# Patient Record
Sex: Male | Born: 1990 | Race: Black or African American | Hispanic: No | Marital: Single | Smoking: Current some day smoker
Health system: Southern US, Community
[De-identification: ages and names within clinical notes are randomized; demographics above are authoritative.]

---
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
## 2014-05-26 ENCOUNTER — Emergency Department (HOSPITAL_COMMUNITY)
Admission: EM | Admit: 2014-05-26 | Discharge: 2014-05-27 | Disposition: A | Discharge status: Home / Self Care | Payer: Self-pay | Attending: Emergency Medicine | Admitting: Emergency Medicine

## 2014-05-26 ENCOUNTER — Encounter (HOSPITAL_COMMUNITY): Payer: Self-pay | Admitting: *Deleted

## 2014-05-26 DIAGNOSIS — J069 Acute upper respiratory infection, unspecified: Secondary | ICD-10-CM | POA: Insufficient documentation

## 2014-05-26 DIAGNOSIS — B9789 Other viral agents as the cause of diseases classified elsewhere: Secondary | ICD-10-CM

## 2014-05-26 NOTE — ED Notes (Signed)
The pt is c/o a cold body aches chills sweating cough raw throat for 7 days.

## 2014-05-27 ENCOUNTER — Emergency Department (HOSPITAL_COMMUNITY): Payer: Self-pay

## 2014-05-27 LAB — RAPID STREP SCREEN (MED CTR MEBANE ONLY): STREPTOCOCCUS, GROUP A SCREEN (DIRECT): NEGATIVE

## 2014-05-27 NOTE — ED Provider Notes (Signed)
CSN: [id number]     Arrival date & time 05/26/14  2258 History   First MD Initiated Contact with Patient 05/26/14 2359     Chief Complaint  Patient presents with  . Generalized Body Aches     (Consider location/radiation/quality/duration/timing/severity/associated sxs/prior Treatment) HPI Arthur Vaughn is a 24 y.o. male . No medical problems, presents emergency Department complaining of cough, congestion, sore throat, body aches, chills, fever for the last week. Patient states symptoms are not improving. He has not been taking anything until this evening when he took ibuprofen. He has not been checking his temperatures, but states he felt hot. He denies any nausea or vomiting. No diarrhea. No recent ill contacts.He reports no other complaints at this time. Nothing is making his symptoms better or worse   History reviewed. No pertinent past medical history. History reviewed. No pertinent past surgical history. No family history on file. History  Substance Use Topics  . Smoking status: Never Smoker   . Smokeless tobacco: Not on file  . Alcohol Use: No    Review of Systems  Constitutional: Positive for fever and chills.  HENT: Positive for congestion and sore throat. Negative for ear pain.   Respiratory: Positive for cough. Negative for chest tightness and shortness of breath.   Cardiovascular: Negative for chest pain, palpitations and leg swelling.  Gastrointestinal: Negative for nausea, vomiting, abdominal pain, diarrhea and abdominal distention.  Genitourinary: Negative for dysuria, urgency, frequency and hematuria.  Musculoskeletal: Negative for myalgias, arthralgias, neck pain and neck stiffness.  Skin: Negative for rash.  Allergic/Immunologic: Negative for immunocompromised state.  Neurological: Negative for dizziness, weakness, light-headedness, numbness and headaches.  All other systems reviewed and are negative.     Allergies  Review of patient's allergies indicates no  known allergies.  Home Medications   Prior to Admission medications   Not on File   BP 114/70 mmHg  Pulse 66  Temp(Src) 98.9 F (37.2 C) (Oral)  Resp 16  Ht 5\' 7"  (1.702 m)  Wt 135 lb (61.236 kg)  BMI 21.14 kg/m2  SpO2 99% Physical Exam  Constitutional: He is oriented to person, place, and time. He appears well-developed and well-nourished. No distress.  HENT:  Head: Normocephalic and atraumatic.  Right Ear: Tympanic membrane and external ear normal.  Left Ear: Tympanic membrane and external ear normal.  Nose: Mucosal edema and rhinorrhea present.  Mouth/Throat: Posterior oropharyngeal erythema present. No oropharyngeal exudate or posterior oropharyngeal edema.  Eyes: Conjunctivae are normal.  Neck: Normal range of motion. Neck supple.  Cardiovascular: Normal rate, regular rhythm and normal heart sounds.   Pulmonary/Chest: Effort normal and breath sounds normal. No respiratory distress. He has no wheezes. He has no rales.  Abdominal: Soft. Bowel sounds are normal. He exhibits no distension. There is no tenderness. There is no rebound.  Musculoskeletal: He exhibits no edema.  Lymphadenopathy:    He has no cervical adenopathy.  Neurological: He is alert and oriented to person, place, and time.  Skin: Skin is warm and dry.  Nursing note and vitals reviewed.   ED Course  Procedures (including critical care time) Labs Review Labs Reviewed  RAPID STREP SCREEN  CULTURE, GROUP A STREP    Imaging Review Dg Chest 2 View  05/27/2014   CLINICAL DATA:  Body aches and chills.  Cough for 7 days.  EXAM: CHEST  2 VIEW  COMPARISON:  None.  FINDINGS: Midline trachea.  Normal heart size and mediastinal contours.  Sharp costophrenic angles.  No pneumothorax.  Clear lungs.  IMPRESSION: No active cardiopulmonary disease.   Electronically Signed   By: Jeronimo Greaves M.D.   On: 05/27/2014 01:26     EKG Interpretation None      MDM   Final diagnoses:  Viral URI with cough    Patient  is here with URI symptoms for 7 days, productive cough with green sputum. Will get chest x-ray, oropharynx erythematous, strep screen obtained. Vital signs are normal, he is nontoxic appearing otherwise.  CXR and strep screen negative. Pt has normal vs. No abnormal exam findings. Most likely viral uri. Home with symptomatic treatments.   Filed Vitals:   05/26/14 2322 05/27/14 0208  BP: 114/70 120/80  Pulse: 66 65  Temp: 98.9 F (37.2 C) 98 F (36.7 C)  TempSrc: Oral Oral  Resp: 16 16  Height:  (1.702 m)   Weight: 135 lb (61.236 kg)   SpO2: 99% 98%     Jaynie Crumble, PA-C 05/27/14 1720  Doug Sou, MD 05/27/14 2320

## 2014-05-27 NOTE — Discharge Instructions (Signed)
Take tylenol or motrin for cough and congestion. Rest. Drink plenty of fluids. Robitussin and mucinex for cough. Sudafed for congestion. Follow up with urgent care as needed.    Upper Respiratory Infection, Adult An upper respiratory infection (URI) is also sometimes known as the common cold. The upper respiratory tract includes the nose, sinuses, throat, trachea, and bronchi. Bronchi are the airways leading to the lungs. Most people improve within 1 week, but symptoms can last up to 2 weeks. A residual cough may last even longer.  CAUSES Many different viruses can infect the tissues lining the upper respiratory tract. The tissues become irritated and inflamed and often become very moist. Mucus production is also common. A cold is contagious. You can easily spread the virus to others by oral contact. This includes kissing, sharing a glass, coughing, or sneezing. Touching your mouth or nose and then touching a surface, which is then touched by another person, can also spread the virus. SYMPTOMS  Symptoms typically develop 1 to 3 days after you come in contact with a cold virus. Symptoms vary from person to person. They may include:  Runny nose.  Sneezing.  Nasal congestion.  Sinus irritation.  Sore throat.  Loss of voice (laryngitis).  Cough.  Fatigue.  Muscle aches.  Loss of appetite.  Headache.  Low-grade fever. DIAGNOSIS  You might diagnose your own cold based on familiar symptoms, since most people get a cold 2 to 3 times a year. Your caregiver can confirm this based on your exam. Most importantly, your caregiver can check that your symptoms are not due to another disease such as strep throat, sinusitis, pneumonia, asthma, or epiglottitis. Blood tests, throat tests, and X-rays are not necessary to diagnose a common cold, but they may sometimes be helpful in excluding other more serious diseases. Your caregiver will decide if any further tests are required. RISKS AND  COMPLICATIONS  You may be at risk for a more severe case of the common cold if you smoke cigarettes, have chronic heart disease (such as heart failure) or lung disease (such as asthma), or if you have a weakened immune system. The very young and very old are also at risk for more serious infections. Bacterial sinusitis, middle ear infections, and bacterial pneumonia can complicate the common cold. The common cold can worsen asthma and chronic obstructive pulmonary disease (COPD). Sometimes, these complications can require emergency medical care and may be life-threatening. PREVENTION  The best way to protect against getting a cold is to practice good hygiene. Avoid oral or hand contact with people with cold symptoms. Wash your hands often if contact occurs. There is no clear evidence that vitamin C, vitamin E, echinacea, or exercise reduces the chance of developing a cold. However, it is always recommended to get plenty of rest and practice good nutrition. TREATMENT  Treatment is directed at relieving symptoms. There is no cure. Antibiotics are not effective, because the infection is caused by a virus, not by bacteria. Treatment may include:  Increased fluid intake. Sports drinks offer valuable electrolytes, sugars, and fluids.  Breathing heated mist or steam (vaporizer or shower).  Eating chicken soup or other clear broths, and maintaining good nutrition.  Getting plenty of rest.  Using gargles or lozenges for comfort.  Controlling fevers with ibuprofen or acetaminophen as directed by your caregiver.  Increasing usage of your inhaler if you have asthma. Zinc gel and zinc lozenges, taken in the first 24 hours of the common cold, can shorten the duration and  lessen the severity of symptoms. Pain medicines may help with fever, muscle aches, and throat pain. A variety of non-prescription medicines are available to treat congestion and runny nose. Your caregiver can make recommendations and may  suggest nasal or lung inhalers for other symptoms.  HOME CARE INSTRUCTIONS   Only take over-the-counter or prescription medicines for pain, discomfort, or fever as directed by your caregiver.  Use a warm mist humidifier or inhale steam from a shower to increase air moisture. This may keep secretions moist and make it easier to breathe.  Drink enough water and fluids to keep your urine clear or pale yellow.  Rest as needed.  Return to work when your temperature has returned to normal or as your caregiver advises. You may need to stay home longer to avoid infecting others. You can also use a face mask and careful hand washing to prevent spread of the virus. SEEK MEDICAL CARE IF:   After the first few days, you feel you are getting worse rather than better.  You need your caregiver's advice about medicines to control symptoms.  You develop chills, worsening shortness of breath, or brown or red sputum. These may be signs of pneumonia.  You develop yellow or brown nasal discharge or pain in the face, especially when you bend forward. These may be signs of sinusitis.  You develop a fever, swollen neck glands, pain with swallowing, or white areas in the back of your throat. These may be signs of strep throat. SEEK IMMEDIATE MEDICAL CARE IF:   You have a fever.  You develop severe or persistent headache, ear pain, sinus pain, or chest pain.  You develop wheezing, a prolonged cough, cough up blood, or have a change in your usual mucus (if you have chronic lung disease).  You develop sore muscles or a stiff neck. Document Released: 07/08/2000 Document Revised: 04/06/2011 Document Reviewed: 04/19/2013 [facility] Patient Information 2015 [place], [place]. This information is not intended to replace advice given to you by your health care provider. Make sure you discuss any questions you have with your health care provider.

## 2014-05-29 LAB — CULTURE, GROUP A STREP

## 2014-06-20 ENCOUNTER — Encounter (HOSPITAL_COMMUNITY): Payer: Self-pay | Admitting: Emergency Medicine

## 2014-06-20 ENCOUNTER — Emergency Department (HOSPITAL_COMMUNITY)
Admission: EM | Admit: 2014-06-20 | Discharge: 2014-06-21 | Disposition: A | Discharge status: Home / Self Care | Payer: Self-pay | Attending: Emergency Medicine | Admitting: Emergency Medicine

## 2014-06-20 DIAGNOSIS — R51 Headache: Secondary | ICD-10-CM | POA: Insufficient documentation

## 2014-06-20 DIAGNOSIS — B9789 Other viral agents as the cause of diseases classified elsewhere: Secondary | ICD-10-CM

## 2014-06-20 DIAGNOSIS — J069 Acute upper respiratory infection, unspecified: Secondary | ICD-10-CM | POA: Insufficient documentation

## 2014-06-20 DIAGNOSIS — J029 Acute pharyngitis, unspecified: Secondary | ICD-10-CM | POA: Insufficient documentation

## 2014-06-20 NOTE — ED Notes (Signed)
Pt. reports sore throat with occasional dry cough onset this week , denies fever / respirations unlabored .

## 2014-06-21 MED ORDER — DEXAMETHASONE SODIUM PHOSPHATE 10 MG/ML IJ SOLN
10.0000 mg | Freq: Once | INTRAMUSCULAR | Status: AC
  Administered 2014-06-21: 10 mg via INTRAMUSCULAR
  Filled 2014-06-21: qty 1

## 2014-06-21 MED ORDER — IBUPROFEN 800 MG PO TABS: 800.0000 mg | ORAL_TABLET | Freq: Three times a day (TID) | ORAL | Status: DC | PRN

## 2014-06-21 MED ORDER — IBUPROFEN 800 MG PO TABS
800.0000 mg | ORAL_TABLET | Freq: Once | ORAL | Status: AC
  Administered 2014-06-21: 800 mg via ORAL
  Filled 2014-06-21: qty 1

## 2014-06-21 MED ORDER — DM-GUAIFENESIN ER 30-600 MG PO TB12
1.0000 | ORAL_TABLET | Freq: Two times a day (BID) | ORAL | Status: DC
  Administered 2014-06-21: 1 via ORAL
  Filled 2014-06-21 (×4): qty 1

## 2014-06-21 MED ORDER — DM-GUAIFENESIN ER 30-600 MG PO TB12: 1.0000 | ORAL_TABLET | Freq: Two times a day (BID) | ORAL | Status: DC | PRN

## 2014-06-21 NOTE — ED Notes (Signed)
The pt has a headache and he has a sore throat for 3 days

## 2014-06-21 NOTE — ED Notes (Signed)
Waiting on med from [facility]

## 2014-06-21 NOTE — ED Provider Notes (Signed)
CSN: [id number]     Arrival date & time 06/20/14  2248 History  This chart was scribed for Tomasita Crumble, MD by Annye Asa, ED Scribe. This patient was seen in room A12C/A12C and the patient's care was started at 2:07 AM.    Chief Complaint  Patient presents with  . Sore Throat   The history is provided by the patient. No language interpreter was used.     HPI Comments: Arthur Vaughn is a 24 y.o. male who presents to the Emergency Department complaining of 3 days of sore throat and neck discomfort. Patient explains he was recently ill with a URI but his symptoms now include neck discomfort, described as tenderness under the jaw, and headaches. He also notes productive cough (yellow-green sputum).  He has been taking ibuprofen without relief; 4 tablets at a time, last dose 04:00 yesterday. He denies fevers, numbness in the lower extremities.   History reviewed. No pertinent past medical history. History reviewed. No pertinent past surgical history. No family history on file. History  Substance Use Topics  . Smoking status: Never Smoker   . Smokeless tobacco: Not on file  . Alcohol Use: No    Review of Systems  Constitutional: Negative for fever.  HENT: Positive for sore throat.   Respiratory: Positive for cough.   Neurological: Positive for headaches. Negative for numbness.  All other systems reviewed and are negative.     Allergies  Review of patient's allergies indicates no known allergies.  Home Medications   Prior to Admission medications   Medication Sig Start Date End Date Taking? Authorizing Provider  ibuprofen (ADVIL,MOTRIN) 200 MG tablet Take 200-800 mg by mouth every 6 (six) hours as needed for moderate pain.   Yes Historical Provider, MD   BP 152/93 mmHg  Pulse 80  Temp(Src) 98.4 F (36.9 C)  Resp 16  Wt 148 lb (67.132 kg)  SpO2 98% Physical Exam  Constitutional: He is oriented to person, place, and time. He appears well-developed and well-nourished. No  distress.  HENT:  Head: Normocephalic and atraumatic.  Mouth/Throat: Oropharyngeal exudate present.  Oropharyngeal erythema and exudate  Eyes: EOM are normal. Pupils are equal, round, and reactive to light.  Neck: Normal range of motion. Neck supple. No JVD present.  Cardiovascular: Normal rate, regular rhythm and normal heart sounds.  Exam reveals no gallop and no friction rub.   No murmur heard. Pulmonary/Chest: Effort normal and breath sounds normal. No respiratory distress. He has no wheezes. He has no rales.  Abdominal: Soft. Bowel sounds are normal. He exhibits no mass. There is no tenderness. There is no rebound and no guarding.  Musculoskeletal: Normal range of motion. He exhibits no edema.  Moves all extremities normally.   Lymphadenopathy:    He has no cervical adenopathy.  Neurological: He is alert and oriented to person, place, and time. He displays normal reflexes. No cranial nerve deficit. He exhibits normal muscle tone.  Normal strength and sensation in all extremities, normal cerebella testing, normal gait  Skin: Skin is warm and dry. No rash noted.  Psychiatric: He has a normal mood and affect. His behavior is normal. Judgment normal.  Nursing note and vitals reviewed.   ED Course  Procedures   DIAGNOSTIC STUDIES: Oxygen Saturation is 98% on RA, normal by my interpretation.    COORDINATION OF CARE: 2:07 AM Discussed treatment plan with pt at bedside and pt agreed to plan.   Labs Review Labs Reviewed - No data to display  Imaging  Review No results found.   EKG Interpretation None      MDM   Final diagnoses:  None    Patient since emergency department for viral URI like symptoms for the past 3 days. He complains of sore throat, pain in his neck, productive cough, headache. He was advised to be evaluated for possible meningitis. I do not believe his history is consistent with this. He has sick contacts and little children with similar symptoms. He is  likely having severe headache due to his viral URI. He was given Decadron, Motrin, Mucinex in emergency department for relief.  Patient will be advised to continue Motrin and Mucinex L (a prescription. Primary care follow-up was advised within 3 days. His vital signs were within his normal limits and he is safe for discharge.  I personally performed the services described in this documentation, which was scribed in my presence. The recorded information has been reviewed and is accurate.     [name], MD 06/21/14 [phone]

## 2014-06-21 NOTE — Discharge Instructions (Signed)
Upper Respiratory Infection, Adult Arthur Vaughn, take Motrin and Mucinex as prescribed. See a primary care physician within 3 days for close follow-up. Is very important for you to make this appointment. If any symptoms worsen come back to emergency department immediately. Thank you. An upper respiratory infection (URI) is also known as the common cold. It is often caused by a type of germ (virus). Colds are easily spread (contagious). You can pass it to others by kissing, coughing, sneezing, or drinking out of the same glass. Usually, you get better in 1 or 2 weeks.  HOME CARE   Only take medicine as told by your doctor.  Use a warm mist humidifier or breathe in steam from a hot shower.  Drink enough water and fluids to keep your pee (urine) clear or pale yellow.  Get plenty of rest.  Return to work when your temperature is back to normal or as told by your doctor. You may use a face mask and wash your hands to stop your cold from spreading. GET HELP RIGHT AWAY IF:   After the first few days, you feel you are getting worse.  You have questions about your medicine.  You have chills, shortness of breath, or brown or red spit (mucus).  You have yellow or brown snot (nasal discharge) or pain in the face, especially when you bend forward.  You have a fever, puffy (swollen) neck, pain when you swallow, or white spots in the back of your throat.  You have a bad headache, ear pain, sinus pain, or chest pain.  You have a high-pitched whistling sound when you breathe in and out (wheezing).  You have a lasting cough or cough up blood.  You have sore muscles or a stiff neck. MAKE SURE YOU:   Understand these instructions.  Will watch your condition.  Will get help right away if you are not doing well or get worse. Document Released: 07/01/2007 Document Revised: 04/06/2011 Document Reviewed: 04/19/2013 [facility] Patient Information 2015 [place], [place]. This information is not intended to  replace advice given to you by your health care provider. Make sure you discuss any questions you have with your health care provider. Pharyngitis Pharyngitis is a sore throat (pharynx). There is redness, pain, and swelling of your throat. HOME CARE   Drink enough fluids to keep your pee (urine) clear or pale yellow.  Only take medicine as told by your doctor.  You may get sick again if you do not take medicine as told. Finish your medicines, even if you start to feel better.  Do not take aspirin.  Rest.  Rinse your mouth (gargle) with salt water ( tsp of salt per 1 qt of water) every 1-2 hours. This will help the pain.  If you are not at risk for choking, you can suck on hard candy or sore throat lozenges. GET HELP IF:  You have large, tender lumps on your neck.  You have a rash.  You cough up green, yellow-brown, or bloody spit. GET HELP RIGHT AWAY IF:   You have a stiff neck.  You drool or cannot swallow liquids.  You throw up (vomit) or are not able to keep medicine or liquids down.  You have very bad pain that does not go away with medicine.  You have problems breathing (not from a stuffy nose). MAKE SURE YOU:   Understand these instructions.  Will watch your condition.  Will get help right away if you are not doing well or get  worse. Document Released: 07/01/2007 Document Revised: 11/02/2012 Document Reviewed: 09/19/2012 [facility] Patient Information 2015 [place], [place]. This information is not intended to replace advice given to you by your health care provider. Make sure you discuss any questions you have with your health care provider.

## 2014-06-21 NOTE — ED Notes (Signed)
Pt c/o sore throat and stiff neck x's 3 days.  St's when he puts chin to his chest his neck feels tight.  Also st's he has felt like he has had elevated temp over past 3 days

## 2014-08-10 ENCOUNTER — Emergency Department (HOSPITAL_COMMUNITY)
Admission: EM | Admit: 2014-08-10 | Discharge: 2014-08-10 | Disposition: A | Discharge status: Home / Self Care | Payer: Self-pay | Attending: Emergency Medicine | Admitting: Emergency Medicine

## 2014-08-10 ENCOUNTER — Encounter (HOSPITAL_COMMUNITY): Payer: Self-pay | Admitting: Emergency Medicine

## 2014-08-10 DIAGNOSIS — J029 Acute pharyngitis, unspecified: Secondary | ICD-10-CM | POA: Insufficient documentation

## 2014-08-10 LAB — RAPID STREP SCREEN (MED CTR MEBANE ONLY): STREPTOCOCCUS, GROUP A SCREEN (DIRECT): NEGATIVE

## 2014-08-10 MED ORDER — ACETAMINOPHEN 325 MG PO TABS
325.0000 mg | ORAL_TABLET | Freq: Once | ORAL | Status: DC
  Filled 2014-08-10: qty 1

## 2014-08-10 MED ORDER — IBUPROFEN 400 MG PO TABS
800.0000 mg | ORAL_TABLET | Freq: Once | ORAL | Status: AC
  Administered 2014-08-10: 800 mg via ORAL
  Filled 2014-08-10: qty 2

## 2014-08-10 MED ORDER — DEXAMETHASONE SODIUM PHOSPHATE 10 MG/ML IJ SOLN
10.0000 mg | Freq: Once | INTRAMUSCULAR | Status: AC
  Administered 2014-08-10: 10 mg via INTRAMUSCULAR
  Filled 2014-08-10: qty 1

## 2014-08-10 MED ORDER — PENICILLIN G BENZATHINE 1200000 UNIT/2ML IM SUSP
1.2000 10*6.[IU] | Freq: Once | INTRAMUSCULAR | Status: AC
  Administered 2014-08-10: 1.2 10*6.[IU] via INTRAMUSCULAR
  Filled 2014-08-10: qty 2

## 2014-08-10 NOTE — ED Notes (Signed)
Patient states sore throat x 2 days ago.  Patient states "Ive felt hot, but I take nyquil and that helps".   Patient denies N/V.  Patient complains of "more scratchiness than anything".

## 2014-08-10 NOTE — ED Provider Notes (Signed)
CSN: [id number]     Arrival date & time 08/10/14  0749 History   First MD Initiated Contact with Patient 08/10/14 [phone]     Chief Complaint  Patient presents with  . Sore Throat     (Consider location/radiation/quality/duration/timing/severity/associated sxs/prior Treatment) Patient is a 24 y.o. male presenting with pharyngitis. The history is provided by the patient. No language interpreter was used.  Sore Throat This is a new problem. The current episode started in the past 7 days. The problem occurs constantly. The problem has been unchanged. Associated symptoms include a fever and a sore throat. Pertinent negatives include no coughing or rash. The symptoms are aggravated by swallowing. Treatments tried: otc cold medication.    History reviewed. No pertinent past medical history. History reviewed. No pertinent past surgical history. No family history on file. History  Substance Use Topics  . Smoking status: Never Smoker   . Smokeless tobacco: Not on file  . Alcohol Use: No    Review of Systems  Constitutional: Positive for fever.  HENT: Positive for sore throat.   Respiratory: Negative for cough.   Skin: Negative for rash.  All other systems reviewed and are negative.     Allergies  Review of patient's allergies indicates no known allergies.  Home Medications   Prior to Admission medications   Medication Sig Start Date End Date Taking? Authorizing Provider  dextromethorphan-guaiFENesin (MUCINEX DM) 30-600 MG per 12 hr tablet Take 1 tablet by mouth 2 (two) times daily as needed for cough (and congestion). 06/21/14   Tomasita CrumbleAdeleke Oni, MD  ibuprofen (ADVIL,MOTRIN) 800 MG tablet Take 1 tablet (800 mg total) by mouth every 8 (eight) hours as needed for headache or moderate pain. 06/21/14   Tomasita CrumbleAdeleke Oni, MD   BP 121/76 mmHg  Pulse 116  Temp(Src) 102.4 F (39.1 C) (Oral)  Resp 18  SpO2 98% Physical Exam  Constitutional: He is oriented to person, place, and time. He appears  well-developed and well-nourished.  HENT:  Right Ear: External ear normal.  Left Ear: External ear normal.  Mouth/Throat: Oropharyngeal exudate and posterior oropharyngeal erythema present.  Eyes: Conjunctivae and EOM are normal. Pupils are equal, round, and reactive to light.  Cardiovascular: Normal rate.   Pulmonary/Chest: Effort normal and breath sounds normal.  Musculoskeletal: Normal range of motion.  Neurological: He is alert and oriented to person, place, and time.  Skin: Skin is warm and dry.  Nursing note and vitals reviewed.   ED Course  Procedures (including critical care time) Labs Review Labs Reviewed  RAPID STREP SCREEN (NOT AT [facility])  CULTURE, GROUP A STREP    Imaging Review No results found.   EKG Interpretation None      MDM   Final diagnoses:  Pharyngitis    History and exam consistent with strep. Pt treated with bicillin and steroids here    Teressa LowerVrinda Danashia Landers, NP 08/10/14 [phone]  Cathren LaineKevin Steinl, MD 08/10/14 1340

## 2014-08-10 NOTE — ED Notes (Signed)
Declined W/C at D/C and was escorted to lobby by RN. 

## 2014-08-10 NOTE — Discharge Instructions (Signed)

## 2014-08-12 LAB — CULTURE, GROUP A STREP

## 2014-10-26 ENCOUNTER — Encounter (HOSPITAL_COMMUNITY): Payer: Self-pay | Admitting: Emergency Medicine

## 2014-10-26 ENCOUNTER — Emergency Department (HOSPITAL_COMMUNITY)
Admission: EM | Admit: 2014-10-26 | Discharge: 2014-10-26 | Disposition: A | Discharge status: Home / Self Care | Payer: BLUE CROSS/BLUE SHIELD | Attending: Emergency Medicine | Admitting: Emergency Medicine

## 2014-10-26 DIAGNOSIS — J029 Acute pharyngitis, unspecified: Secondary | ICD-10-CM | POA: Insufficient documentation

## 2014-10-26 LAB — RAPID STREP SCREEN (MED CTR MEBANE ONLY): STREPTOCOCCUS, GROUP A SCREEN (DIRECT): NEGATIVE

## 2014-10-26 MED ORDER — IBUPROFEN 400 MG PO TABS
400.0000 mg | ORAL_TABLET | Freq: Once | ORAL | Status: AC
  Administered 2014-10-26: 400 mg via ORAL
  Filled 2014-10-26: qty 1

## 2014-10-26 NOTE — ED Provider Notes (Signed)
CSN: [id number]     Arrival date & time 10/26/14  2015 History  By signing my name below, I, Placido Sou, attest that this documentation has been prepared under the direction and in the presence of [employer], PA-C. Electronically Signed: Placido Sou, ED Scribe. 10/26/2014. 9:07 PM.   Chief Complaint  Patient presents with  . Sore Throat   The history is provided by the patient. No language interpreter was used.   HPI Comments: Arthur Vaughn is a 24 y.o. male who presents to the Emergency Department complaining of a constant, mild, sore throat with onset 5 days ago. He notes having been seen multiple times recently for the same symptoms but denies any change in their severity. Pt notes associated, mild,  HA and diaphoresis. Pt also notes a decreased appetite although there is a foot long sandwich next to him. He notes taking NSAIDs, Nyquil and Dayquil which have provided on relief of his symptoms. He notes a sick contact at work but is unsure of her illness. He denies any rhinorrhea or cough.   No past medical history on file. No past surgical history on file. No family history on file. Social History  Substance Use Topics  . Smoking status: Never Smoker   . Smokeless tobacco: Not on file  . Alcohol Use: No    Review of Systems A complete 10 system review of systems was obtained and all systems are negative except as noted in the HPI and PMH.   Allergies  Review of patient's allergies indicates no known allergies.  Home Medications   Prior to Admission medications   Medication Sig Start Date End Date Taking? Authorizing Provider  dextromethorphan-guaiFENesin (MUCINEX DM) 30-600 MG per 12 hr tablet Take 1 tablet by mouth 2 (two) times daily as needed for cough (and congestion). 06/21/14   Tomasita Crumble, MD  ibuprofen (ADVIL,MOTRIN) 800 MG tablet Take 1 tablet (800 mg total) by mouth every 8 (eight) hours as needed for headache or moderate pain. 06/21/14   Tomasita Crumble, MD    There were no vitals taken for this visit. Physical Exam  Constitutional: He is oriented to person, place, and time. He appears well-developed and well-nourished.  HENT:  Head: Normocephalic and atraumatic.  Mouth/Throat: No oropharyngeal exudate.  No drooling or stridor. Posterior pharynx mildly erythematous no significant tonsillar hypertrophy. No exudate. Soft palate rises symmetrically. No TTP or induration under tongue.   No tenderness to palpation of frontal or bilateral maxillary sinuses.  No mucosal edema in the nares.  Bilateral tympanic membranes obscured by cerumen    Eyes: Conjunctivae are normal. Pupils are equal, round, and reactive to light.  Neck: Normal range of motion. No tracheal deviation present.  Follows commands, Clear, goal oriented speech, Strength is 5 out of 5x4 extremities, patient ambulates with a coordinated in nonantalgic gait. Sensation is grossly intact.   Cardiovascular: Normal rate, regular rhythm and intact distal pulses.   Pulmonary/Chest: Effort normal and breath sounds normal. No respiratory distress. He has no wheezes. He has no rales. He exhibits no tenderness.  Abdominal: Soft. Bowel sounds are normal. There is no tenderness.  Musculoskeletal: Normal range of motion.  Lymphadenopathy:    He has no cervical adenopathy.  Neurological: He is alert and oriented to person, place, and time.  Follows commands, Clear, goal oriented speech, Strength is 5 out of 5x4 extremities, patient ambulates with a coordinated in nonantalgic gait. Sensation is grossly intact.   Skin: Skin is warm and dry. He is  not diaphoretic.  Psychiatric: He has a normal mood and affect. His behavior is normal.  Nursing note and vitals reviewed.  ED Course  Procedures  DIAGNOSTIC STUDIES: Oxygen Saturation is 100% on RA, normal by my interpretation.    COORDINATION OF CARE: 9:05 PM Discussed treatment plan with pt at bedside and pt agreed to plan.  Labs Review Labs  Reviewed - No data to display  Imaging Review No results found.   EKG Interpretation None      MDM   Final diagnoses:  Viral pharyngitis    Filed Vitals:   10/26/14 2046  BP: 116/73  Pulse: 72  Temp: 98.4 F (36.9 C)  TempSrc: Oral  Resp: 18  Height:  (1.702 m)  Weight: 140 lb (63.504 kg)  SpO2: 100%    Medications  ibuprofen (ADVIL,MOTRIN) tablet 400 mg (400 mg Oral Given 10/26/14 2244)    Arthur Vaughn is a pleasant 24 y.o. male presenting with sore throat, headache and fatigue. Physical exam is not consistent with strep, vital signs stable with no fever. Patient reports headache neuro exam is nonfocal. This is likely a viral syndrome. Patient is encouraged to take Motrin for symptomatic control. Rapid strep negative.  Evaluation does not show pathology that would require ongoing emergent intervention or inpatient treatment. Pt is hemodynamically stable and mentating appropriately. Discussed findings and plan with patient/guardian, who agrees with care plan. All questions answered. Return precautions discussed and outpatient follow up given.   I personally performed the services described in this documentation, which was scribed in my presence. The recorded information has been reviewed and is accurate.    Wynetta Emery, PA-C 10/26/14 2249  Tilden Fossa, MD 10/27/14 [phone]

## 2014-10-26 NOTE — ED Notes (Signed)
Pt c/o sore throat x's 5 days with elevated temp and headache off and on

## 2014-10-26 NOTE — Discharge Instructions (Signed)
For pain control please take ibuprofen (also known as Motrin or Advil) 800mg  (this is normally 4 over the counter pills) 3 times a day  for 5 days. Take with food to minimize stomach irritation.  Do not hesitate to return to the emergency room for any new, worsening or concerning symptoms.  Please obtain primary care using resource guide below. Let them know that you were seen in the emergency room and that they will need to obtain records for further outpatient management.   Pharyngitis Pharyngitis is a sore throat (pharynx). There is redness, pain, and swelling of your throat. HOME CARE   Drink enough fluids to keep your pee (urine) clear or pale yellow.  Only take medicine as told by your doctor.  You may get sick again if you do not take medicine as told. Finish your medicines, even if you start to feel better.  Do not take aspirin.  Rest.  Rinse your mouth (gargle) with salt water ( tsp of salt per 1 qt of water) every 1-2 hours. This will help the pain.  If you are not at risk for choking, you can suck on hard candy or sore throat lozenges. GET HELP IF:  You have large, tender lumps on your neck.  You have a rash.  You cough up green, yellow-brown, or bloody spit. GET HELP RIGHT AWAY IF:   You have a stiff neck.  You drool or cannot swallow liquids.  You throw up (vomit) or are not able to keep medicine or liquids down.  You have very bad pain that does not go away with medicine.  You have problems breathing (not from a stuffy nose). MAKE SURE YOU:   Understand these instructions.  Will watch your condition.  Will get help right away if you are not doing well or get worse. Document Released: 07/01/2007 Document Revised: 11/02/2012 Document Reviewed: 09/19/2012 [facility] Patient Information 2015 [place], [place]. This information is not intended to replace advice given to you by your health care provider. Make sure you discuss any questions you have with your  health care provider.   Emergency Department Resource Guide 1) Find a Doctor and Pay Out of Pocket Although you won't have to find out who is covered by your insurance plan, it is a good idea to ask around and get recommendations. You will then need to call the office and see if the doctor you have chosen will accept you as a new patient and what types of options they offer for patients who are self-pay. Some doctors offer discounts or will set up payment plans for their patients who do not have insurance, but you will need to ask so you aren't surprised when you get to your appointment.  2) Contact Your Local Health Department Not all health departments have doctors that can see patients for sick visits, but many do, so it is worth a call to see if yours does. If you don't know where your local health department is, you can check in your phone book. The CDC also has a tool to help you locate your state's health department, and many state websites also have listings of all of their local health departments.  3) Find a Walk-in Clinic If your illness is not likely to be very severe or complicated, you may want to try a walk in clinic. These are popping up all over the country in pharmacies, drugstores, and shopping centers. They're usually staffed by nurse practitioners or physician assistants that have been  trained to treat common illnesses and complaints. They're usually fairly quick and inexpensive. However, if you have serious medical issues or chronic medical problems, these are probably not your best option.  No Primary Care Doctor: - Call Health Connect at  [phone] - they can help you locate a primary care doctor that  accepts your insurance, provides certain services, etc. - Physician Referral Service- [phone]  Chronic Pain Problems: Organization         Address  Phone   Notes  [facility]  [phone] Patients need to be referred by their primary care doctor.    Medication Assistance: Organization         Address  Phone   Notes  [facility] Medication [facility] [address] [place] [place], [place] [address] [phone] --Must be a resident of [facility] -- Must have NO insurance coverage whatsoever (no Medicaid/ Medicare, etc.) -- The pt. MUST have a primary care doctor that directs their care regularly and follows them in the community   [facility]  [phone]   [employer]  [phone]    Agencies that provide inexpensive medical care: Organization         Address  Phone   Notes  [facility]  [phone]   [facility]    [phone]   [facility] [address] [place], [place] [address] [phone]   [facility] [address] [place] [phone]   Planned Parenthood    [phone]   [facility]    [phone]   [facility]  [address], [place] Phone:  [phone], Fax:  [phone] Hours of Operation:  9 am - 6 pm, M-F.  Also accepts Medicaid/Medicare and self-pay.  [facility]  [address], [place] Phone: [phone], Fax: [phone]. Hours of Operation:  8:30 am - 5:30 pm, M-F.  Also accepts Medicaid and self-pay.  [facility] [place] [address], [place] Phone: [phone]   [facility] [address] Natasha Bence [place], [place] [phone], Ext. [phone] Mondays & Thursdays: 7-9 AM.  First 15 patients are seen on a first come, first serve basis.    Medicaid-accepting [facility] Providers:  Organization         Address  Phone   Notes  [facility] [address], [place] [phone] Also accepts self-pay patients.  [facility] [address] Laurell Josephs [place], [place]  [phone]   [facility] [address], [place] [phone]   [facility] Family Medicine [address] [place] [phone]   Renaye Rakers [address], [place]   [phone] Only accepts [place] Access [place] patients after they have their name applied to their card.   Self-Pay (no insurance) in [facility]:  Organization         Address  Phone   Notes  Sickle Cell Patients, [facility] Internal Medicine [address] [place], [place] [phone]   [facility] [address] [place], [place] [phone]   [facility] [place]  [address], [place] [phone]   [facility]/Dr. Osei-Bonsu  [address] [place] or [place]  [address] Laurell Josephs [address], [place] [phone] Phone number for both [facility] and [place] locations is the same.  Urgent Medical and [facility] [address], [place] [phone]   [facility] [address] [place] or [address] [phone]   [facility] [address] [place] [phone], phone; [phone], fax Sees patients 1st and 3rd Saturday of every month.  Must not qualify for public or private insurance (i.e. Medicaid, Medicare, [place] Health Choice, Veterans' Benefits)  Household income should be no more than 200% of the poverty level The clinic cannot treat you if you are pregnant or think you are pregnant  Sexually transmitted diseases are not treated at the clinic.    Dental Care: Organization         Address  Phone  Notes  [facility] Department [facility] [address] [place], [place] [phone] Accepts children up to age 5 who are enrolled in [place] or [place] Health Choice; pregnant women with a Medicaid card; and children who have applied for Medicaid or [place] Health Choice, but were declined, whose parents can pay a reduced fee at time of service.    [facility] Department of [facility]  [address], [place] [phone] Accepts children up to age 66 who are enrolled in [place] or [place] Health Choice; pregnant women with a Medicaid card; and children who have applied for Medicaid or  Health Choice, but were declined, whose parents can pay a reduced fee at time of service.  [facility]  [address] [place], [place] [phone] Patients are seen by appointment only. Walk-ins are not accepted. [facility] will see patients 15 years of age and older. Monday - Tuesday (8am-5pm) Most Wednesdays (8:30-5pm) $30 per visit, cash only  [facility] Adult Dental Access PROGRAM  [address], [facility] [phone] Patients are seen by appointment only. Walk-ins are not accepted. [facility] will see patients 74 years of age and older. One Wednesday Evening (Monthly: Volunteer Based).  $30 per visit, cash only  [employer]  [phone] for adults; Children under age 89, call Graduate Pediatric Dentistry at [phone]. Children aged 71-14, please call [phone] to request a pediatric application.  Dental services are provided in all areas of dental care including fillings, crowns and bridges, complete and partial dentures, implants, gum treatment, root canals, and extractions. Preventive care is also provided. Treatment is provided to both adults and children. Patients are selected via a lottery and there is often a waiting list.   [facility] [address] [place]  [phone] [email or web address]   [facility] [address] [place], [place] [phone], Ext. [phone] Second and Fourth Thursday of each month, opens at 6:30 AM; Clinic ends at 9 AM.  Patients are seen on a first-come first-served basis, and a limited number are seen during each clinic.   [facility]  [address] Ether Griffins [place], [place] [phone]   Eligibility Requirements You must have lived in [place], [place], or [place] counties for at least the last three months.   You cannot be eligible for state or federal sponsored [place], including [employer], [place], or [employer].   You generally cannot be eligible for healthcare insurance through your employer.    How to apply: Eligibility screenings  are held every Tuesday and Wednesday afternoon from 1:00 pm until 4:00 pm. You do not need an appointment for the interview!  [facility] [address], [place], [place] [phone]   [facility] Health Department  [phone]   [facility] Health Department  [phone]   [facility] Health Department  [phone]    Behavioral Health Resources in the Community: Intensive Outpatient Programs Organization         Address  Phone  Notes  [facility] [address] [place], [place] [phone]   [facility] Outpatient [address] [place], [place] [phone]   [facility] [address] [place], [place]  [phone]   [facility] [address],  [place], [place] [phone] or [phone]   Substance Abuse Resources Organization         Address  Phone  Notes  Al[facility] Drug Services  [phone]   [facility]  [phone]   [facility] [place]  [phone]   Floydene Flock  [phone]   Residential & Outpatient Substance Abuse Program  [phone]   Psychological Services Organization         Address  Phone  Notes  [facility]  [phone]   [facility]  [phone]   [facility] Mental [facility] [address], [place] [phone] or [phone]    Mobile Crisis Teams Organization         Address  Phone  Notes  [facility], Mobile Crisis Care Unit  [phone]   [facility]  [address] [place], [place] [phone]   Doristine Locks [address] [place] [phone]    Self-Help/Support Groups Organization         Address  Phone             Notes  [facility] [place] - variety of support groups  336- I7437963 Call for more information  Narcotics Anonymous (NA), [facility] [address], [employer] [place]  2 meetings at this location   Statistician         Address  Phone  Notes  [facility] [address],    [place]  [phone]   [facility]  [address] [place] [address], [place], [place] [phone]   [facility] [address] [place], [place] [phone] Admissions: 8am-3pm M-F  [facility] [address],    [place], [place] [phone]   [facility] [address] [place], [place], [place] [phone]   [facility] [address]  [place], [place] [phone]   [facility] - Intensive Outpatient [address] Laurell Josephs [address], [place], [place] [phone]   [facility] ([facility] [address]  [place], [place] [phone] or [phone]   [facility] ([facility]) [address] [place], [place] [phone] Accepts Medicaid  [facility] [address],  [place] [phone] Substance Abuse/Addiction Treatment   [facility] Organization         Address  Phone  Notes  [facility]  [phone]   Angie Fava, PhD [address] Ervin Knack [place], [place]   [phone] or [phone]   [facility]   [address] [place], [place] [phone]   [facility] [address] [place], [place] [phone] Insurance/Medicaid/sponsorship  through [facility] [address]                                    [place], [place] [phone]  Therapy/tele-psych/case  [facility] [address]   [place], [place] [phone]    Dr. Lolly Mustache  [phone]   [facility] [place]  [employer] [facility] Dept. 1) [address] [place] 2) [address] [place] 3)  [address], [place] [phone]  [phone]   [facility] Child Abuse Hotline [phone] or [phone] (After Hours)

## 2014-10-30 LAB — CULTURE, GROUP A STREP

## 2016-10-17 IMAGING — DX DG CHEST 2V
2 series · 2 of 2 positions shown · non-contrast
Comparison: None.

CLINICAL DATA: Body aches and chills.  Cough for 7 days.

EXAM:
CHEST  2 VIEW

[chest pa]
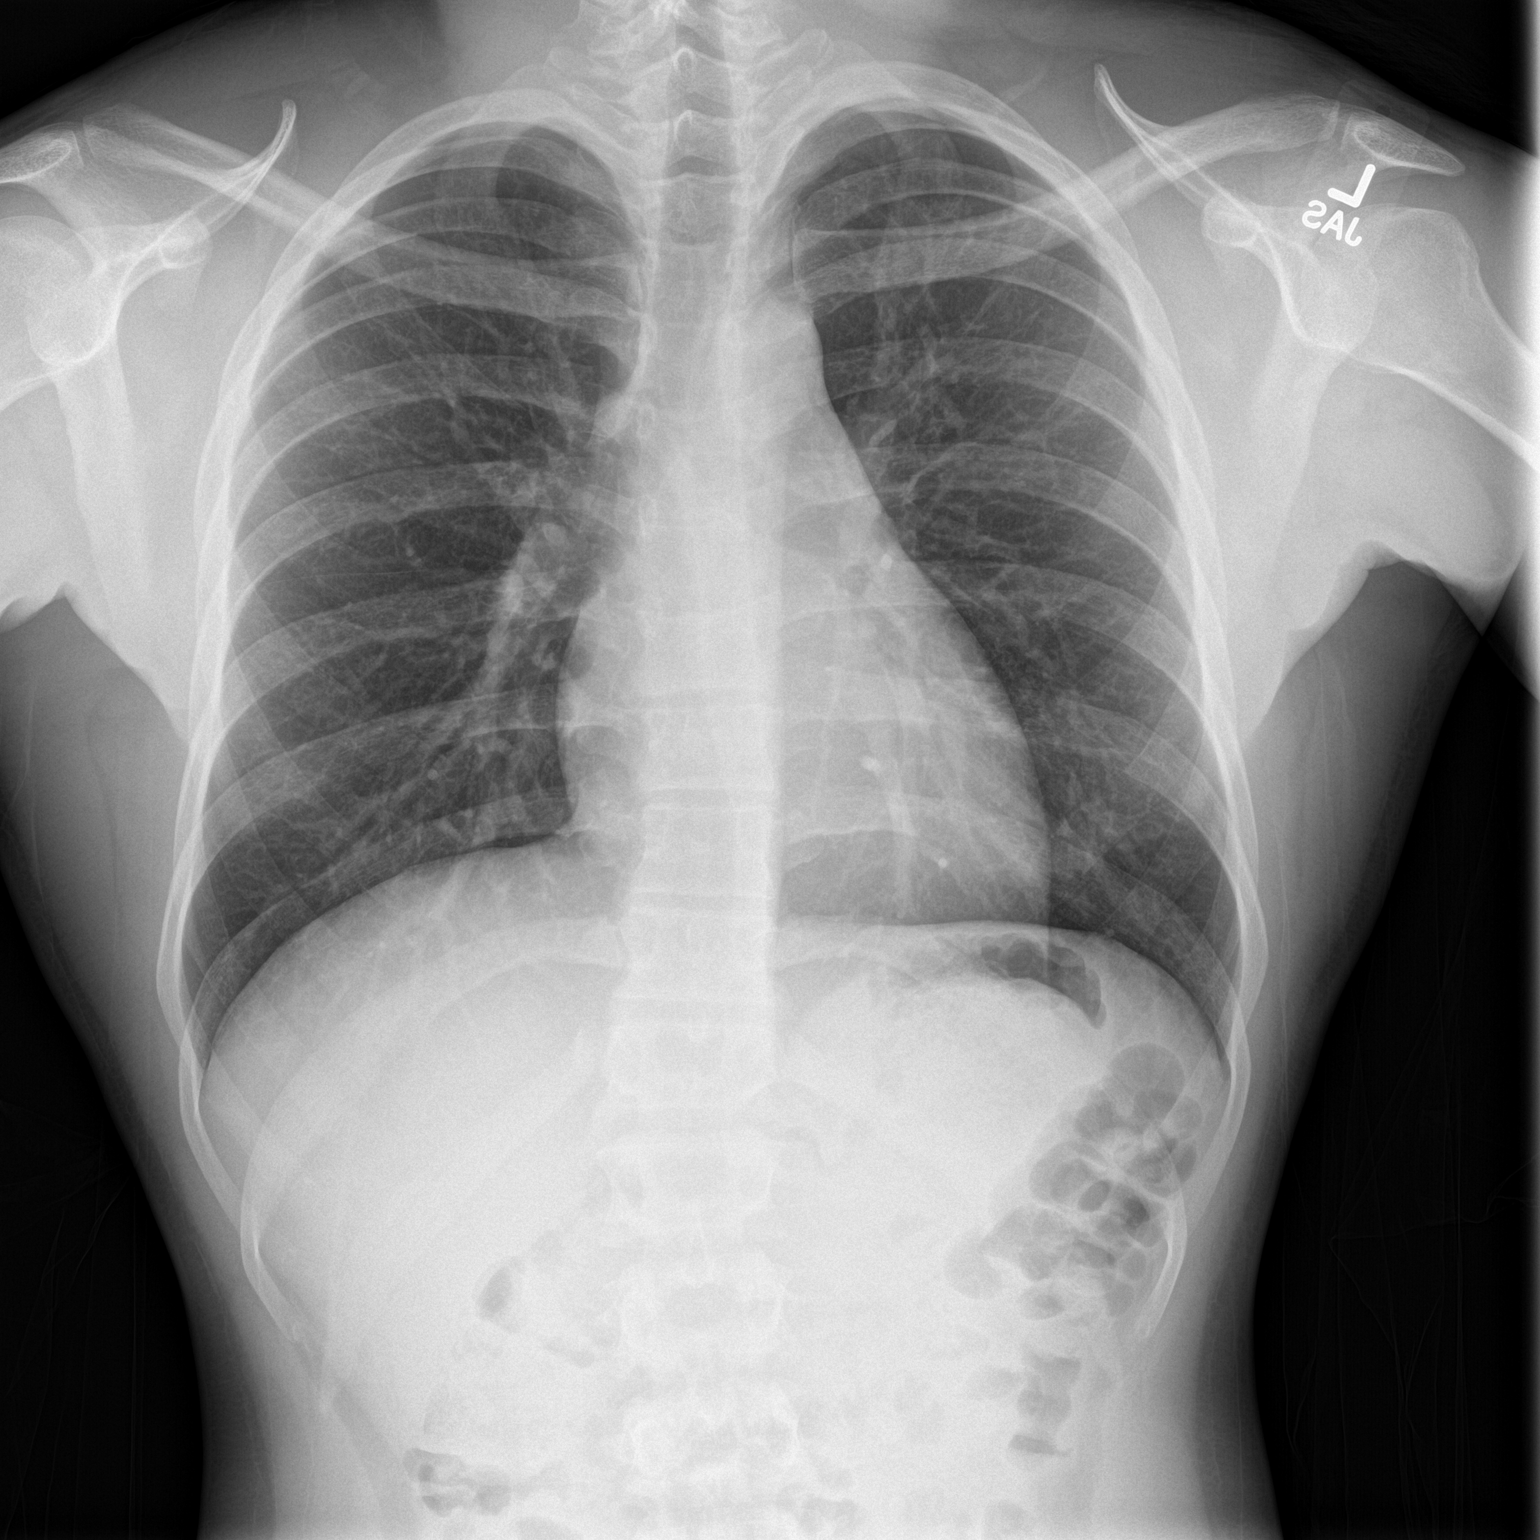

[chest lat]
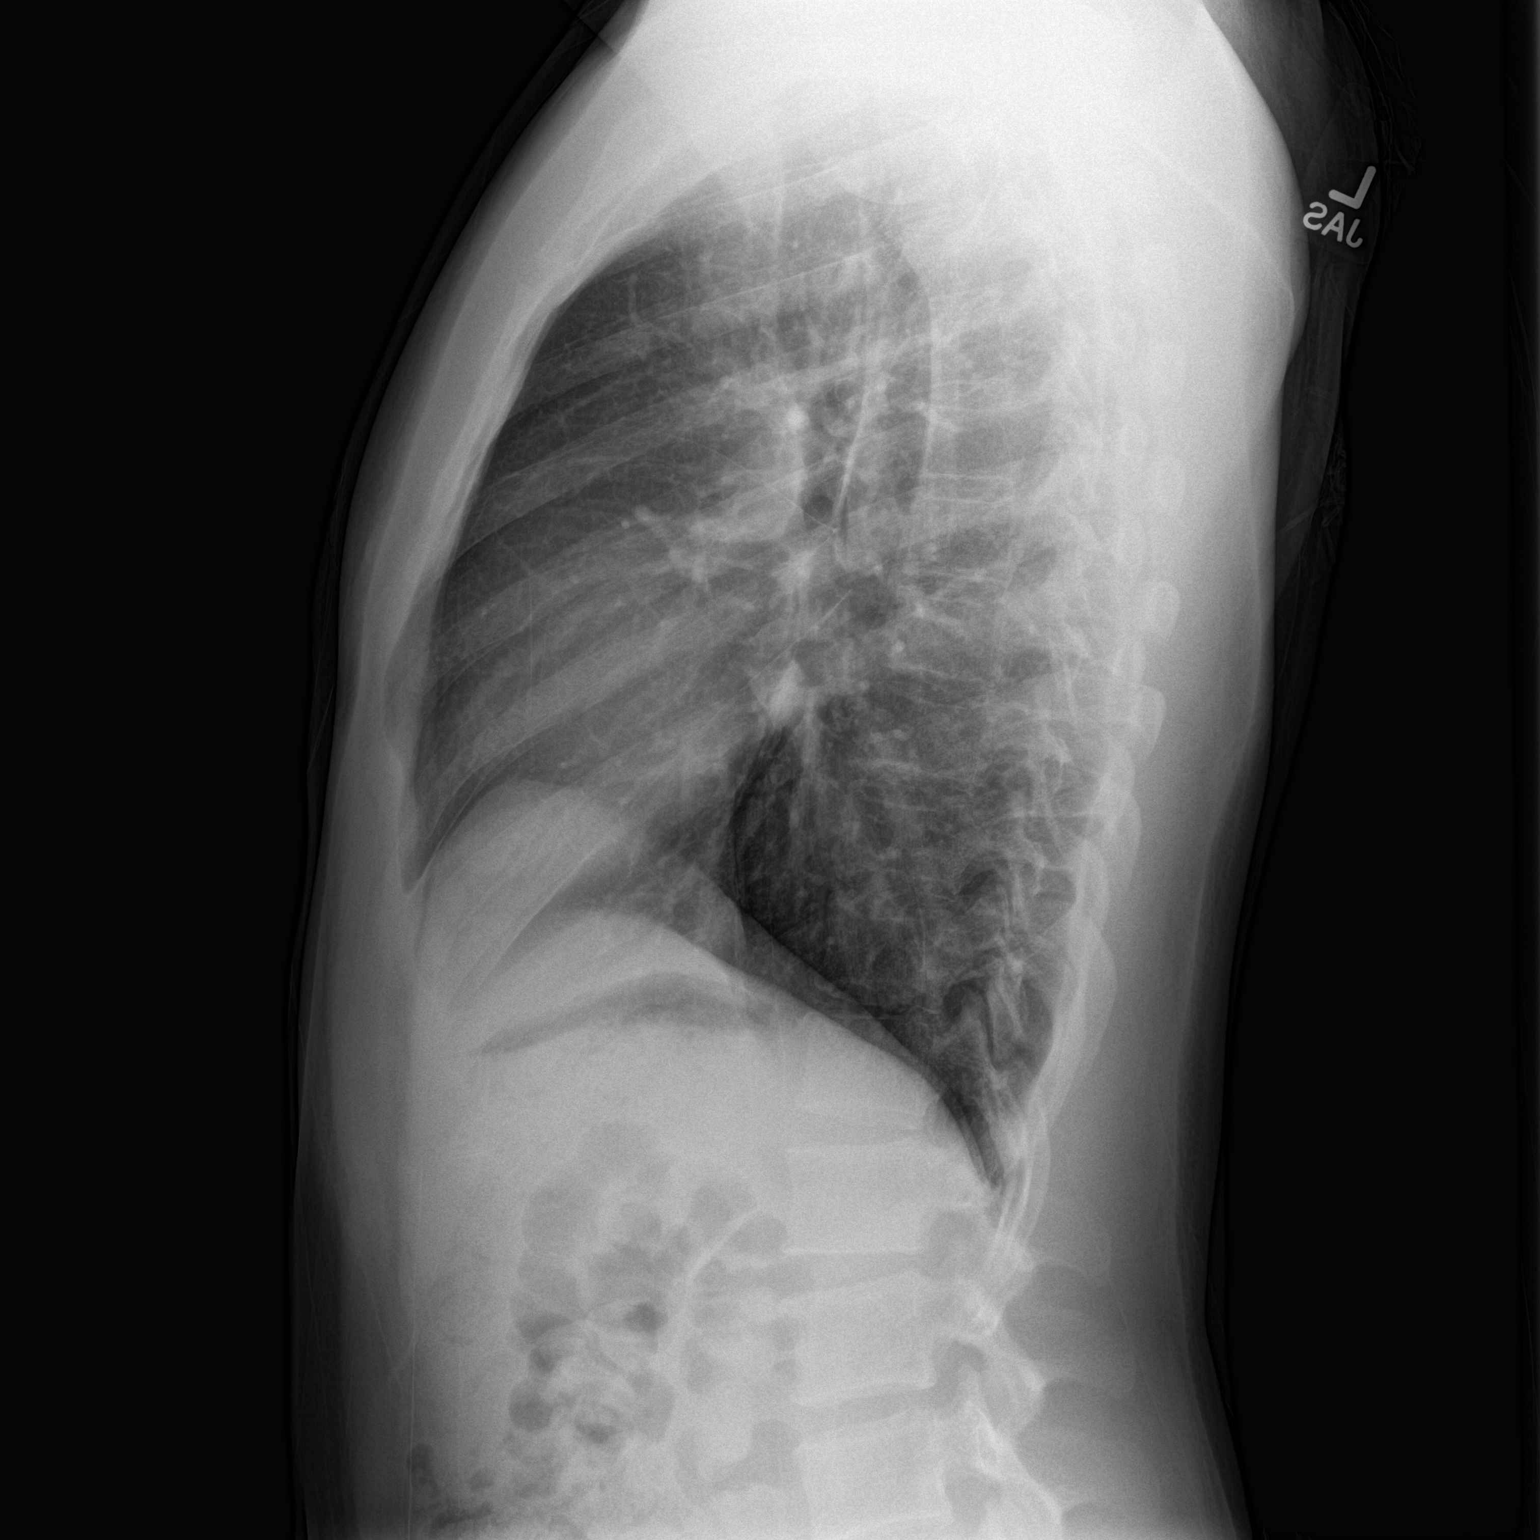

[2 of 2 positions shown; findings below may reference images not displayed]

FINDINGS: Midline trachea.  Normal heart size and mediastinal contours.

Sharp costophrenic angles.  No pneumothorax.  Clear lungs.
IMPRESSION: No active cardiopulmonary disease.

## 2016-12-24 ENCOUNTER — Emergency Department (HOSPITAL_COMMUNITY)
Admission: EM | Admit: 2016-12-24 | Discharge: 2016-12-24 | Disposition: A | Discharge status: Home / Self Care | Payer: BLUE CROSS/BLUE SHIELD | Attending: Emergency Medicine | Admitting: Emergency Medicine

## 2016-12-24 ENCOUNTER — Encounter (HOSPITAL_COMMUNITY): Payer: Self-pay | Admitting: Emergency Medicine

## 2016-12-24 ENCOUNTER — Other Ambulatory Visit: Payer: Self-pay

## 2016-12-24 DIAGNOSIS — R51 Headache: Secondary | ICD-10-CM | POA: Insufficient documentation

## 2016-12-24 DIAGNOSIS — J029 Acute pharyngitis, unspecified: Secondary | ICD-10-CM

## 2016-12-24 DIAGNOSIS — M791 Myalgia, unspecified site: Secondary | ICD-10-CM | POA: Insufficient documentation

## 2016-12-24 DIAGNOSIS — R6883 Chills (without fever): Secondary | ICD-10-CM | POA: Insufficient documentation

## 2016-12-24 LAB — RAPID STREP SCREEN (MED CTR MEBANE ONLY): STREPTOCOCCUS, GROUP A SCREEN (DIRECT): NEGATIVE

## 2016-12-24 MED ORDER — AMOXICILLIN 500 MG PO CAPS: 500.0000 mg | ORAL_CAPSULE | Freq: Two times a day (BID) | ORAL | 0 refills | Status: AC

## 2016-12-24 MED ORDER — IBUPROFEN 100 MG/5ML PO SUSP
800.0000 mg | Freq: Once | ORAL | Status: AC
  Administered 2016-12-24: 800 mg via ORAL
  Filled 2016-12-24: qty 40

## 2016-12-24 MED ORDER — IBUPROFEN 600 MG PO TABS: 600.0000 mg | ORAL_TABLET | Freq: Four times a day (QID) | ORAL | 0 refills | Status: DC | PRN

## 2016-12-24 MED ORDER — ACETAMINOPHEN 325 MG PO TABS
650.0000 mg | ORAL_TABLET | Freq: Once | ORAL | Status: AC | PRN
  Administered 2016-12-24: 650 mg via ORAL
  Filled 2016-12-24: qty 2

## 2016-12-24 MED ORDER — DEXAMETHASONE SODIUM PHOSPHATE 10 MG/ML IJ SOLN
10.0000 mg | Freq: Once | INTRAMUSCULAR | Status: AC
  Administered 2016-12-24: 10 mg via INTRAMUSCULAR
  Filled 2016-12-24: qty 1

## 2016-12-24 NOTE — ED Provider Notes (Signed)
[facility] EMERGENCY DEPARTMENT Provider Note   CSN: [id number] Arrival date & time: 12/24/16  1827     History   Chief Complaint Chief Complaint  Patient presents with  . Sore Throat  . Headache    HPI Arthur Vaughn is a 26 y.o. male.  HPI   Patient is a 26 year old male with no significant past medical history presenting for acute on chronic sore throat.  Patient reports that he has been traveling a lot for his work and has had some intermittent sore throats as well as body aches and sweats with no recorded fevers over the last 2 months.  Patient reports that his sore throat got acutely worse in the last 24 hours.  Patient reports that his been painful to swallow and he has not eaten much in the last 24 hours.  Additionally, patient reports that he is getting a typical headache for him that has been recurrent over the past 2 months.  It is a bitemporal headache that extends bilaterally postauricularly.  It is throbbing in nature. NO visual changes or neck stiffness. Patient is able to relieve it with ibuprofen.  Patient has an occasional cough but it is not productive of sputum.  History reviewed. No pertinent past medical history.  There are no active problems to display for this patient.   History reviewed. No pertinent surgical history.     Home Medications    Prior to Admission medications   Medication Sig Start Date End Date Taking? Authorizing Provider  amoxicillin (AMOXIL) 500 MG capsule Take 1 capsule (500 mg total) by mouth 2 (two) times daily for 10 days. 12/24/16 01/03/17  Aviva Kluver B, PA-C  dextromethorphan-guaiFENesin (MUCINEX DM) 30-600 MG per 12 hr tablet Take 1 tablet by mouth 2 (two) times daily as needed for cough (and congestion). 06/21/14   Tomasita Crumble, MD  ibuprofen (ADVIL,MOTRIN) 600 MG tablet Take 1 tablet (600 mg total) by mouth every 6 (six) hours as needed. 12/24/16   Elisha Ponder, PA-C    Family History History  reviewed. No pertinent family history.  Social History Social History   Tobacco Use  . Smoking status: Never Smoker  . Smokeless tobacco: Never Used  Substance Use Topics  . Alcohol use: Yes    Comment: occass  . Drug use: Yes    Types: Marijuana     Allergies   Patient has no known allergies.   Review of Systems Review of Systems  Constitutional: Positive for chills and fever.  HENT: Positive for sore throat. Negative for congestion, trouble swallowing and voice change.   Eyes: Negative for visual disturbance.  Gastrointestinal: Negative for abdominal pain, nausea and vomiting.  Musculoskeletal: Positive for arthralgias and myalgias.  Skin: Negative for rash.  Neurological: Positive for headaches.     Physical Exam Updated Vital Signs BP 117/75 (BP Location: Right Arm)   Pulse 80   Temp 98.8 F (37.1 C) (Oral)   Resp 14   SpO2 100%   Physical Exam  Constitutional: He appears well-developed and well-nourished. No distress.  Sitting comfortably in examination chair.  HENT:  Head: Normocephalic and atraumatic.  Right Ear: Tympanic membrane normal.  Left Ear: Tympanic membrane normal.  Mouth/Throat: Mucous membranes are normal.  Normal phonation. No muffled voice sounds. Patient swallows secretions without difficulty. Dentition normal. No lesions of tongue or buccal mucosa. Uvula midline. No asymmetric swelling of the posterior pharynx.+ erythema of posterior pharynx. + tonsillar exuduate. No lingual swelling. No induration inferior  to tongue. No submandibular tenderness, swelling, or induration.  Tissues of the neck supple. No cervical lymphadenopathy. Right TM without erythema or effusion; left TM without erythema or effusion.  Eyes: Conjunctivae are normal. Right eye exhibits no discharge. Left eye exhibits no discharge.  EOMs normal to gross examination.  Neck: Normal range of motion.  Cardiovascular: Normal rate, regular rhythm and normal heart sounds.    Intact, 2+ radial pulse.  Pulmonary/Chest: Effort normal and breath sounds normal. No respiratory distress. He has no wheezes. He has no rhonchi. He has no rales.  Normal respiratory effort. Patient converses comfortably. No audible wheeze or stridor.  Abdominal: He exhibits no distension.  Musculoskeletal: Normal range of motion.  Neurological: He is alert.  Cranial nerves intact to gross observation. Patient moves extremities without difficulty.  Skin: Skin is warm and dry. He is not diaphoretic.  Psychiatric: He has a normal mood and affect. His behavior is normal. Judgment and thought content normal.  Nursing note and vitals reviewed.    ED Treatments / Results  Labs (all labs ordered are listed, but only abnormal results are displayed) Labs Reviewed  RAPID STREP SCREEN (NOT AT [facility])  CULTURE, GROUP A STREP [facility]    EKG  EKG Interpretation None       Radiology No results found.  Procedures Procedures (including critical care time)  Medications Ordered in ED Medications  acetaminophen (TYLENOL) tablet 650 mg (650 mg Oral Given 12/24/16 1844)  ibuprofen (ADVIL,MOTRIN) 100 MG/5ML suspension 800 mg (800 mg Oral Given 12/24/16 2016)  dexamethasone (DECADRON) injection 10 mg (10 mg Intramuscular Given 12/24/16 2016)     Initial Impression / Assessment and Plan / ED Course  I have reviewed the triage vital signs and the nursing notes.  Pertinent labs & imaging results that were available during my care of the patient were reviewed by me and considered in my medical decision making (see chart for details).     Final Clinical Impressions(s) / ED Diagnoses   Final diagnoses:  Pharyngitis, unspecified etiology   [name] is a 26 y.o. male who presents to ED for sore throat, chills, and myalgias. Patient nontoxic appearing and in no acute distress. Rapid strep negative, however tonsillar exam demonstrates exudate and patient is tachycardic. Remain suspicious for  strep pharyngitis. Presentation not concerning for PTA or infection spread to soft tissue. No trismus or uvula deviation. Patient with normal phonation. Exam demonstrates soft neck tissue, no swelling or induration inferior to the tongue or in the submandibular space  Treated in the ED with steroids, NSAIDs. Rx for amoxicillin. Patient able to drink water in ED without difficulty with intact air way.  Tachycardia improved with oral rehydration.  Specific return precautions discussed for change in voice, inability to tolerate secretions, difficulty breathing or swallowing, or increased nausea or vomiting. Discussed importance of hydration. Recommended PCP follow up.  I also discussed that patient should follow-up with a primary care provider regarding his ongoing symptoms and headache management.  I do not feel that this current episode is related to patient's episodes of chills and arthralgias over the past 2 months, and do believe that this is an acute illness.  All questions answered.    ED Discharge Orders        Ordered    amoxicillin (AMOXIL) 500 MG capsule  2 times daily     12/24/16 2041    ibuprofen (ADVIL,MOTRIN) 600 MG tablet  Every 6 hours PRN     12/24/16 2041  Elisha PonderMurray, Ailyn Gladd B, PA-C 12/25/16 0359    Benjiman CorePickering, Nathan, MD 12/26/16 [id number]

## 2016-12-24 NOTE — ED Triage Notes (Signed)
Pt states he has had a sore throat for several months. Pt also complains of migraines off and on. Pt also states his ears are sensitive- his dtr has had recently ear infection.

## 2016-12-24 NOTE — Discharge Instructions (Signed)
Please read and follow all provided instructions.  Your diagnoses today include:  1. Pharyngitis, unspecified etiology     Tests performed today include: Strep test: was negative for strep throat, however sometimes this can be a false negative and I would like to treat your throat for strep. Your exam is suggestive of strep. Vital signs. See below for your results today.   Medications prescribed:   Take any medications prescribed only as directed.   Please take all of your antibiotics until finished.   You may develop abdominal discomfort or nausea from the antibiotic. If this occurs, you may take it with food. Some patients also get diarrhea with antibiotics. You may help offset this with probiotics which you can buy or get in yogurt. Do not eat or take the probiotics until 2 hours after your antibiotic.  Some people develop allergies to antibiotics. Symptoms of antibiotic allergy can be mild and include a flat rash and itching. They can also be more serious and include:  ?Hives - Hives are raised, red patches of skin that are usually very itchy.  ?Lip or tongue swelling  ?Trouble swallowing or breathing  ?Blistering of the skin or mouth.  If you have any of these serious symptoms, please seek emergency medical care immediately.   Home care instructions:  Please read the educational materials provided and follow any instructions contained in this packet.  Follow-up instructions: Please follow-up with your primary care provider as needed for further evaluation of your symptoms.  Return instructions:  Please return to the Emergency Department if you experience worsening symptoms.  Return if you have worsening problems swallowing, your neck becomes swollen, you cannot swallow your saliva or your voice becomes muffled.  Return with high persistent fever, persistent vomiting, or if you have trouble breathing.  Please return if you have any other emergent concerns.  Additional  Information: Here are some PCP resources: To find a primary care or specialty doctor please call [phone] or [phone] to access "[place] Find a Doctor Service."  You may also go on the [facility] website at [email or web address]  There are also multiple [facility] and [facility] practices throughout the [place] that are frequently accepting new patients. You may find a clinic that is close to your home and contact them.  [facility] [place] [address] [place] [phone]  [facility] in [place] (also locations in [place] and [place]) - [address] [place] [phone]  [facility] - [address] [place] [phone]   Your vital signs today were: BP 127/65 (BP Location: Right Arm)    Pulse (!) 115    Temp (!) 102.1 F (38.9 C) (Oral)    Resp 18    SpO2 100%  If your blood pressure (BP) was elevated above 135/85 this visit, please have this repeated by your doctor within one month.

## 2016-12-26 LAB — CULTURE, GROUP A STREP (THRC)

## 2018-01-06 ENCOUNTER — Emergency Department (HOSPITAL_COMMUNITY)
Admission: EM | Admit: 2018-01-06 | Discharge: 2018-01-06 | Disposition: A | Discharge status: Home / Self Care | Payer: Self-pay | Attending: Emergency Medicine | Admitting: Emergency Medicine

## 2018-01-06 ENCOUNTER — Emergency Department (HOSPITAL_COMMUNITY): Payer: Self-pay

## 2018-01-06 ENCOUNTER — Encounter (HOSPITAL_COMMUNITY): Payer: Self-pay | Admitting: Emergency Medicine

## 2018-01-06 DIAGNOSIS — J111 Influenza due to unidentified influenza virus with other respiratory manifestations: Secondary | ICD-10-CM

## 2018-01-06 DIAGNOSIS — R69 Illness, unspecified: Secondary | ICD-10-CM

## 2018-01-06 DIAGNOSIS — Z79899 Other long term (current) drug therapy: Secondary | ICD-10-CM | POA: Insufficient documentation

## 2018-01-06 DIAGNOSIS — J101 Influenza due to other identified influenza virus with other respiratory manifestations: Secondary | ICD-10-CM | POA: Insufficient documentation

## 2018-01-06 DIAGNOSIS — J029 Acute pharyngitis, unspecified: Secondary | ICD-10-CM

## 2018-01-06 LAB — GROUP A STREP BY PCR: Group A Strep by PCR: NOT DETECTED

## 2018-01-06 MED ORDER — LIDOCAINE VISCOUS HCL 2 % MT SOLN: 15.0000 mL | OROMUCOSAL | 0 refills | Status: DC | PRN

## 2018-01-06 MED ORDER — CETIRIZINE HCL 5 MG PO TABS: 5.0000 mg | ORAL_TABLET | Freq: Every day | ORAL | 0 refills | Status: DC

## 2018-01-06 MED ORDER — BENZONATATE 100 MG PO CAPS: 200.0000 mg | ORAL_CAPSULE | Freq: Three times a day (TID) | ORAL | 0 refills | Status: DC

## 2018-01-06 MED ORDER — AMOXICILLIN 500 MG PO CAPS: 1000.0000 mg | ORAL_CAPSULE | Freq: Every day | ORAL | 0 refills | Status: AC

## 2018-01-06 MED ORDER — ACETAMINOPHEN 325 MG PO TABS
650.0000 mg | ORAL_TABLET | Freq: Once | ORAL | Status: AC | PRN
  Administered 2018-01-06: 650 mg via ORAL
  Filled 2018-01-06: qty 2

## 2018-01-06 MED ORDER — DEXAMETHASONE SODIUM PHOSPHATE 10 MG/ML IJ SOLN
10.0000 mg | Freq: Once | INTRAMUSCULAR | Status: AC
  Administered 2018-01-06: 10 mg via INTRAMUSCULAR
  Filled 2018-01-06: qty 1

## 2018-01-06 MED ORDER — IBUPROFEN 400 MG PO TABS
400.0000 mg | ORAL_TABLET | Freq: Once | ORAL | Status: AC
  Administered 2018-01-06: 400 mg via ORAL
  Filled 2018-01-06: qty 1

## 2018-01-06 MED ORDER — ACETAMINOPHEN 325 MG PO TABS: 650.0000 mg | ORAL_TABLET | Freq: Four times a day (QID) | ORAL | 0 refills | Status: DC | PRN

## 2018-01-06 MED ORDER — IBUPROFEN 800 MG PO TABS: 400.0000 mg | ORAL_TABLET | Freq: Three times a day (TID) | ORAL | 0 refills | Status: DC

## 2018-01-06 NOTE — ED Triage Notes (Signed)
BIB EMS from home, pt reports cough, body aches, chills, sore throat X2 days. Ambulatory, VSS.

## 2018-01-06 NOTE — ED Notes (Signed)
Pt stable, ambulatory, states understanding of discharge instructions 

## 2018-01-06 NOTE — ED Notes (Signed)
Patient transported to X-ray 

## 2018-01-06 NOTE — ED Provider Notes (Signed)
[facility] EMERGENCY DEPARTMENT Provider Note   CSN: [id number] Arrival date & time: 01/06/18  1916     History   Chief Complaint Chief Complaint  Patient presents with  . Generalized Body Aches    HPI Arthur Vaughn is a 27 y.o. male who presents to ED for 48-hour history of generalized body aches, subjective fever, sore throat, cough productive with mucus.  He took 1 dose of NyQuil about 12 hours ago with no improvement in his symptoms.  Sick contacts including significant other with similar symptoms.  He did not receive his influenza vaccine this year.  Denies any shortness of breath, chest pain, trismus, drooling.  HPI  History reviewed. No pertinent past medical history.  There are no active problems to display for this patient.   History reviewed. No pertinent surgical history.      Home Medications    Prior to Admission medications   Medication Sig Start Date End Date Taking? Authorizing Provider  acetaminophen (TYLENOL) 325 MG tablet Take 2 tablets (650 mg total) by mouth every 6 (six) hours as needed. 01/06/18   Alijah Akram, PA-C  amoxicillin (AMOXIL) 500 MG capsule Take 2 capsules (1,000 mg total) by mouth daily for 10 days. 01/06/18 01/16/18  Marche Hottenstein, PA-C  benzonatate (TESSALON) 100 MG capsule Take 2 capsules (200 mg total) by mouth every 8 (eight) hours. 01/06/18   Berwyn Bigley, PA-C  cetirizine (ZYRTEC) 5 MG tablet Take 1 tablet (5 mg total) by mouth daily. 01/06/18   Beverly Ferner, PA-C  dextromethorphan-guaiFENesin (MUCINEX DM) 30-600 MG per 12 hr tablet Take 1 tablet by mouth 2 (two) times daily as needed for cough (and congestion). 06/21/14   Tomasita Crumble, MD  ibuprofen (ADVIL,MOTRIN) 800 MG tablet Take 0.5 tablets (400 mg total) by mouth 3 (three) times daily. 01/06/18   Tameron Lama, PA-C  lidocaine (XYLOCAINE) 2 % solution Use as directed 15 mLs in the mouth or throat as needed for mouth pain. 01/06/18   Dietrich Pates, PA-C    Family  History No family history on file.  Social History Social History   Tobacco Use  . Smoking status: Never Smoker  . Smokeless tobacco: Never Used  Substance Use Topics  . Alcohol use: Yes    Comment: occass  . Drug use: Yes    Types: Marijuana     Allergies   Patient has no known allergies.   Review of Systems Review of Systems  Constitutional: Positive for chills and fever.  HENT: Positive for sore throat.   Respiratory: Positive for cough.   Musculoskeletal: Positive for myalgias.     Physical Exam Updated Vital Signs BP 105/64   Pulse (!) 104   Temp (!) 101.8 F (38.8 C) (Oral)   Resp 20   Ht 5\' 7"  (1.702 m)   Wt 68 kg   SpO2 98%   BMI 23.49 kg/m   Physical Exam Vitals signs and nursing note reviewed.  Constitutional:      General: He is not in acute distress.    Appearance: He is well-developed. He is not diaphoretic.  HENT:     Head: Normocephalic and atraumatic.     Right Ear: Tympanic membrane normal.     Left Ear: Tympanic membrane normal.     Nose: Mucosal edema present.     Mouth/Throat:     Tonsils: Tonsillar exudate present. Swelling: 1+ on the right. 1+ on the left.     Comments: Bilaterally, symmetrically enlarged tonsils with  exudates. Patient does not appear to be in acute distress. No trismus or drooling present. No pooling of secretions. Patient is tolerating secretions and is not in respiratory distress. No neck pain or tenderness to palpation of the neck. Full active and passive range of motion of the neck. No evidence of RPA or PTA. Eyes:     General: No scleral icterus.    Conjunctiva/sclera: Conjunctivae normal.  Neck:     Musculoskeletal: Normal range of motion.  Cardiovascular:     Rate and Rhythm: Tachycardia present.  Pulmonary:     Effort: Pulmonary effort is normal. No respiratory distress.     Breath sounds: Normal breath sounds.  Skin:    Findings: No rash.  Neurological:     Mental Status: He is alert.      ED  Treatments / Results  Labs (all labs ordered are listed, but only abnormal results are displayed) Labs Reviewed  GROUP A STREP BY PCR    EKG None  Radiology Dg Chest 2 View  Result Date: 01/06/2018 CLINICAL DATA:  27 year old male with cough and fever. EXAM: CHEST - 2 VIEW COMPARISON:  Chest radiograph dated 05/27/2014 FINDINGS: The heart size and mediastinal contours are within normal limits. Both lungs are clear. The visualized skeletal structures are unremarkable. IMPRESSION: No active cardiopulmonary disease. Electronically Signed   By: Elgie CollardArash  Radparvar M.D.   On: 01/06/2018 21:27    Procedures Procedures (including critical care time)  Medications Ordered in ED Medications  acetaminophen (TYLENOL) tablet 650 mg (650 mg Oral Given 01/06/18 1930)  ibuprofen (ADVIL,MOTRIN) tablet 400 mg (400 mg Oral Given 01/06/18 2019)  dexamethasone (DECADRON) injection 10 mg (10 mg Intramuscular Given 01/06/18 2215)     Initial Impression / Assessment and Plan / ED Course  I have reviewed the triage vital signs and the nursing notes.  Pertinent labs & imaging results that were available during my care of the patient were reviewed by me and considered in my medical decision making (see chart for details).     27 year old male presents to ED for 48-hour history of generalized body aches, subjective fever, sore throat and cough productive of mucus.  Contacts with similar symptoms.  No recent use of antipyretics.  Did not receive his influenza vaccine this year.  On initial exam patient febrile to 102.  Tonsillar exudates noted bilaterally with bilaterally and symmetrically enlarged tonsils.  No trismus, drooling or signs of respiratory distress noted.  No signs of RPA or PTA.  Lungs are clear to auscultation bilaterally.  Chest x-ray is negative.  Strep test is negative.  Patient given antipyretics here with improvement in his fever and tachycardia.  He also reports improvement in his symptoms  with Tylenol and ibuprofen given here.  Given Decadron for tonsillar enlargement.  Suspect that symptoms could be due to influenza or influenza-like illness.  He is somewhat still in the window for Tamiflu administration but he declines medication after speaking about risks and benefits.  I would want to treat him for possible strep based on his vital signs, presentation.  We will also treat symptomatically for flulike symptoms.  Advised to return to ED for any severe worsening symptoms.  Patient is hemodynamically stable, in NAD, and able to ambulate in the ED. Evaluation does not show pathology that would require ongoing emergent intervention or inpatient treatment. I explained the diagnosis to the patient. Pain has been managed and has no complaints prior to discharge. Patient is comfortable with above plan and is  stable for discharge at this time. All questions were answered prior to disposition. Strict return precautions for returning to the ED were discussed. Encouraged follow up with PCP.    Portions of this note were generated with Scientist, clinical (histocompatibility and immunogenetics). Dictation errors may occur despite best attempts at proofreading.   Final Clinical Impressions(s) / ED Diagnoses   Final diagnoses:  Influenza-like illness  Pharyngitis, unspecified etiology    ED Discharge Orders         Ordered    amoxicillin (AMOXIL) 500 MG capsule  Daily     01/06/18 2214    lidocaine (XYLOCAINE) 2 % solution  As needed     01/06/18 2214    acetaminophen (TYLENOL) 325 MG tablet  Every 6 hours PRN     01/06/18 2214    ibuprofen (ADVIL,MOTRIN) 800 MG tablet  3 times daily     01/06/18 2214    cetirizine (ZYRTEC) 5 MG tablet  Daily     01/06/18 2214    benzonatate (TESSALON) 100 MG capsule  Every 8 hours     01/06/18 2214           Dietrich Pates, PA-C 01/06/18 2220    Virgina Norfolk, DO 01/07/18 [phone]

## 2018-01-06 NOTE — Discharge Instructions (Signed)
Return to ED for worsening symptoms, trouble breathing or trouble swallowing, trouble opening your mouth, chest pain.\ Take the amoxicillin for the entire course of the medication even if you start to feel better soon if symptoms do not come back or get worse. Alternate Tylenol and ibuprofen to help with your fever and body aches.

## 2018-01-06 NOTE — ED Notes (Signed)
Pt. returned from XR. 

## 2018-08-31 ENCOUNTER — Emergency Department (HOSPITAL_COMMUNITY)
Admission: EM | Admit: 2018-08-31 | Discharge: 2018-08-31 | Disposition: A | Discharge status: Home / Self Care | Payer: Self-pay | Attending: Emergency Medicine | Admitting: Emergency Medicine

## 2018-08-31 ENCOUNTER — Encounter (HOSPITAL_COMMUNITY): Payer: Self-pay

## 2018-08-31 ENCOUNTER — Other Ambulatory Visit: Payer: Self-pay

## 2018-08-31 DIAGNOSIS — Y92828 Other wilderness area as the place of occurrence of the external cause: Secondary | ICD-10-CM | POA: Insufficient documentation

## 2018-08-31 DIAGNOSIS — Y9389 Activity, other specified: Secondary | ICD-10-CM | POA: Insufficient documentation

## 2018-08-31 DIAGNOSIS — Y999 Unspecified external cause status: Secondary | ICD-10-CM | POA: Insufficient documentation

## 2018-08-31 DIAGNOSIS — W19XXXA Unspecified fall, initial encounter: Secondary | ICD-10-CM | POA: Insufficient documentation

## 2018-08-31 DIAGNOSIS — S5012XA Contusion of left forearm, initial encounter: Secondary | ICD-10-CM | POA: Insufficient documentation

## 2018-08-31 DIAGNOSIS — S0181XA Laceration without foreign body of other part of head, initial encounter: Secondary | ICD-10-CM | POA: Insufficient documentation

## 2018-08-31 DIAGNOSIS — Z23 Encounter for immunization: Secondary | ICD-10-CM | POA: Insufficient documentation

## 2018-08-31 MED ORDER — TETANUS-DIPHTH-ACELL PERTUSSIS 5-2.5-18.5 LF-MCG/0.5 IM SUSP
0.5000 mL | Freq: Once | INTRAMUSCULAR | Status: AC
  Administered 2018-08-31: 0.5 mL via INTRAMUSCULAR
  Filled 2018-08-31: qty 0.5

## 2018-08-31 MED ORDER — ACETAMINOPHEN 325 MG PO TABS
650.0000 mg | ORAL_TABLET | Freq: Once | ORAL | Status: AC
  Administered 2018-08-31: 650 mg via ORAL
  Filled 2018-08-31: qty 2

## 2018-08-31 NOTE — ED Triage Notes (Signed)
Pt observed lying on Rt side with chin resting in Rt hand. Attempted to wake Pt up for assessment and Girl friend also attempted to wake Pt by calling his name . Pt did open eyes after calling his name several times. Pt could only give limited info of his fall. Pt reported he was fishing this AM and fell on bank . Pt does have small scratches on Rt side of face. Pt also reported he had branches and stuff on his chin. Pt reported he was with another person but that person not available for information.

## 2018-08-31 NOTE — ED Provider Notes (Signed)
[facility] EMERGENCY DEPARTMENT Provider Note   CSN: [id number] Arrival date & time: 08/31/18  1003    History   Chief Complaint Chief Complaint  Patient presents with  . Fall    HPI Arthur Vaughn is a 28 y.o. male.     HPI    28 year old male presents status post fall.  Patient notes he was fishing this morning when he fell and hit his head.  He is uncertain how he quite fell, but denies any loss of consciousness.  Patient notes a wound to his chin and bruising to his left forearm.  He notes range of motion of his jaw is slightly painful on both sides nontender to palpation, no neurological deficits neck pain back pain.  No medications prior to arrival.  He is uncertain when his last tetanus shot was.   History reviewed. No pertinent past medical history.  There are no active problems to display for this patient.   History reviewed. No pertinent surgical history.     Home Medications    Prior to Admission medications   Not on File    Family History No family history on file.  Social History Social History   Tobacco Use  . Smoking status: Never Smoker  . Smokeless tobacco: Never Used  Substance Use Topics  . Alcohol use: Yes    Comment: occass  . Drug use: Yes    Types: Marijuana     Allergies   Patient has no known allergies.   Review of Systems Review of Systems  All other systems reviewed and are negative.  Physical Exam Updated Vital Signs BP 121/74 (BP Location: Right Arm)   Pulse 82   Temp 98.3 F (36.8 C) (Oral)   Resp 20   SpO2 96%   Physical Exam Vitals signs and nursing note reviewed.  Constitutional:      Appearance: He is well-developed.  HENT:     Head: Normocephalic and atraumatic.     Comments: 1 cm laceration to the chin with no surrounding redness, no bony involvement-full active range of motion the jaw with pain at the bilateral TMJ, temporal mandibular joint bilateral nontender jaw nontender midface  nontender Eyes:     General: No scleral icterus.       Right eye: No discharge.        Left eye: No discharge.     Conjunctiva/sclera: Conjunctivae normal.     Pupils: Pupils are equal, round, and reactive to light.  Neck:     Musculoskeletal: Normal range of motion.     Vascular: No JVD.     Trachea: No tracheal deviation.  Pulmonary:     Effort: Pulmonary effort is normal.     Breath sounds: No stridor.  Neurological:     General: No focal deficit present.     Mental Status: He is alert and oriented to person, place, and time.     Cranial Nerves: No cranial nerve deficit.     Sensory: No sensory deficit.     Motor: No weakness.     Coordination: Coordination normal.  Psychiatric:        Behavior: Behavior normal.        Thought Content: Thought content normal.        Judgment: Judgment normal.      ED Treatments / Results  Labs (all labs ordered are listed, but only abnormal results are displayed) Labs Reviewed - No data to display  EKG None  Radiology No  results found.  Procedures Procedures (including critical care time)  Medications Ordered in ED Medications  Tdap (BOOSTRIX) injection 0.5 mL (has no administration in time range)  acetaminophen (TYLENOL) tablet 650 mg (650 mg Oral Given 08/31/18 1114)     Initial Impression / Assessment and Plan / ED Course  I have reviewed the triage vital signs and the nursing notes.  Pertinent labs & imaging results that were available during my care of the patient were reviewed by me and considered in my medical decision making (see chart for details).         Assessment/Plan: 28 year old male presents today with laceration status post fall.  He has no signs of intracranial abnormality.  He does have some jaw pain but this is nontender low suspicion for acute fracture.  He is able to hold a tongue depressor in his mouth against traction.  The laceration of the chin would do best with repair here patient refuses repair  and understands the risks including infection without repairing this.  He will return immediately if he develops any signs of infection.  Patient verbalized understanding and agreement to today's plan.  Final Clinical Impressions(s) / ED Diagnoses   Final diagnoses:  Fall, initial encounter  Chin laceration, initial encounter    ED Discharge Orders    None       Rosalio LoudHedges, Gabriella Woodhead, PA-C 08/31/18 1142    Maia PlanLong, Joshua G, MD 08/31/18 2017

## 2018-08-31 NOTE — Discharge Instructions (Addendum)
As we discussed you have a laceration on your chin that would be best suited for sutures.  I would recommend that you keep this wound clean with soap and water and bandage.  If you develop any signs of infection please return to the emergency room immediately.  If the pain in her jaw continues to persist return for repeat evaluation.

## 2018-08-31 NOTE — ED Notes (Signed)
Patient verbalizes understanding of discharge instructions . Opportunity for questions and answers were provided . Armband removed by staff ,Pt discharged from ED. W/C  offered at D/C  and Declined W/C at D/C and was escorted to lobby by RN.  

## 2018-08-31 NOTE — ED Notes (Signed)
Laceration to chin was cleaned and wrapped. Luellen Pucker, RN observed.

## 2018-08-31 NOTE — ED Triage Notes (Signed)
Patient reports that he slipped this am and fell striking mouth and head, no loc. Alert and ambulatory, NAD

## 2019-02-04 ENCOUNTER — Emergency Department (HOSPITAL_COMMUNITY)
Admission: EM | Admit: 2019-02-04 | Discharge: 2019-02-05 | Disposition: A | Discharge status: Home / Self Care | Payer: Self-pay | Attending: Emergency Medicine | Admitting: Emergency Medicine

## 2019-02-04 ENCOUNTER — Encounter (HOSPITAL_COMMUNITY): Payer: Self-pay | Admitting: Emergency Medicine

## 2019-02-04 ENCOUNTER — Other Ambulatory Visit: Payer: Self-pay

## 2019-02-04 DIAGNOSIS — L0231 Cutaneous abscess of buttock: Secondary | ICD-10-CM | POA: Insufficient documentation

## 2019-02-04 NOTE — ED Notes (Signed)
Pt family (girlfried) reports that the patient has been exposed to COVID. Pt daughter and mother have positive tests in the past two weeks. Pt family advises that the pt stated the desire to hurt himself.   Please call at [phone] Arma Heading)

## 2019-02-04 NOTE — ED Triage Notes (Addendum)
Pt c/o severe rectal pain onset yesterday after having a BM  Pt denies any rectal bleeding  Pt unable to sit down

## 2019-02-05 ENCOUNTER — Other Ambulatory Visit: Payer: Self-pay

## 2019-02-05 ENCOUNTER — Emergency Department (HOSPITAL_COMMUNITY): Payer: Self-pay

## 2019-02-05 LAB — CBC WITH DIFFERENTIAL/PLATELET
Abs Immature Granulocytes: 0.09 10*3/uL — ABNORMAL HIGH (ref 0.00–0.07)
Basophils Absolute: 0 10*3/uL (ref 0.0–0.1)
Basophils Relative: 0 %
Eosinophils Absolute: 0 10*3/uL (ref 0.0–0.5)
Eosinophils Relative: 0 %
HCT: 46.4 % (ref 39.0–52.0)
Hemoglobin: 15.6 g/dL (ref 13.0–17.0)
Immature Granulocytes: 1 %
Lymphocytes Relative: 6 %
Lymphs Abs: 0.9 10*3/uL (ref 0.7–4.0)
MCH: 31.5 pg (ref 26.0–34.0)
MCHC: 33.6 g/dL (ref 30.0–36.0)
MCV: 93.5 fL (ref 80.0–100.0)
Monocytes Absolute: 1.5 10*3/uL — ABNORMAL HIGH (ref 0.1–1.0)
Monocytes Relative: 9 %
Neutro Abs: 13.5 10*3/uL — ABNORMAL HIGH (ref 1.7–7.7)
Neutrophils Relative %: 84 %
Platelets: 212 10*3/uL (ref 150–400)
RBC: 4.96 MIL/uL (ref 4.22–5.81)
RDW: 12.9 % (ref 11.5–15.5)
WBC: 16.1 10*3/uL — ABNORMAL HIGH (ref 4.0–10.5)
nRBC: 0 % (ref 0.0–0.2)

## 2019-02-05 LAB — COMPREHENSIVE METABOLIC PANEL
ALT: 16 U/L (ref 0–44)
AST: 19 U/L (ref 15–41)
Albumin: 4.2 g/dL (ref 3.5–5.0)
Alkaline Phosphatase: 47 U/L (ref 38–126)
Anion gap: 10 (ref 5–15)
BUN: 7 mg/dL (ref 6–20)
CO2: 27 mmol/L (ref 22–32)
Calcium: 9.6 mg/dL (ref 8.9–10.3)
Chloride: 104 mmol/L (ref 98–111)
Creatinine, Ser: 1.03 mg/dL (ref 0.61–1.24)
GFR calc Af Amer: >60 mL/min (ref >60)
GFR calc non Af Amer: >60 mL/min (ref >60)
Glucose, Bld: 99 mg/dL (ref 70–99)
Potassium: 3.6 mmol/L (ref 3.5–5.1)
Sodium: 141 mmol/L (ref 135–145)
Total Bilirubin: 0.5 mg/dL (ref 0.3–1.2)
Total Protein: 7.7 g/dL (ref 6.5–8.1)

## 2019-02-05 MED ORDER — IOHEXOL 300 MG/ML  SOLN
100.0000 mL | Freq: Once | INTRAMUSCULAR | Status: AC | PRN
  Administered 2019-02-05: 02:00:00 100 mL via INTRAVENOUS

## 2019-02-05 MED ORDER — DOXYCYCLINE HYCLATE 100 MG PO CAPS: 100.0000 mg | ORAL_CAPSULE | Freq: Two times a day (BID) | ORAL | 0 refills | Status: DC

## 2019-02-05 MED ORDER — PIPERACILLIN-TAZOBACTAM 3.375 G IVPB 30 MIN
3.3750 g | Freq: Once | INTRAVENOUS | Status: AC
  Administered 2019-02-05: 3.375 g via INTRAVENOUS
  Filled 2019-02-05: qty 50

## 2019-02-05 MED ORDER — LIDOCAINE-EPINEPHRINE (PF) 2 %-1:200000 IJ SOLN
10.0000 mL | Freq: Once | INTRAMUSCULAR | Status: AC
  Administered 2019-02-05: 10 mL
  Filled 2019-02-05: qty 20

## 2019-02-05 MED ORDER — HYDROCODONE-ACETAMINOPHEN 5-325 MG PO TABS: 1.0000 | ORAL_TABLET | ORAL | 0 refills | Status: DC | PRN

## 2019-02-05 MED ORDER — MORPHINE SULFATE (PF) 4 MG/ML IV SOLN
4.0000 mg | Freq: Once | INTRAVENOUS | Status: AC
  Administered 2019-02-05: 01:00:00 4 mg via INTRAVENOUS
  Filled 2019-02-05: qty 1

## 2019-02-05 NOTE — ED Provider Notes (Signed)
[facility] EMERGENCY DEPARTMENT Provider Note   CSN: [id number] Arrival date & time: 02/04/19  2217     History Chief Complaint  Patient presents with  . Rectal Pain    Arthur Vaughn is a 29 y.o. male.  Patient to ED with complaint of rectal pain for the past 4 days. He first noticed it after a bowel movement and pain has intensified over that time period. No bleeding, drainage. He denies constipation or having to strain excessively for a bowel movement. He denies any anal penetration or intercourse. No fever, nausea or abdominal pain.  The history is provided by the patient. No language interpreter was used.       History reviewed. No pertinent past medical history.  There are no problems to display for this patient.   History reviewed. No pertinent surgical history.     No family history on file.  Social History   Tobacco Use  . Smoking status: Never Smoker  . Smokeless tobacco: Never Used  Substance Use Topics  . Alcohol use: Yes    Comment: occass  . Drug use: Yes    Types: Marijuana    Home Medications Prior to Admission medications   Not on File    Allergies    Patient has no known allergies.  Review of Systems   Review of Systems  Constitutional: Negative for chills and fever.  Gastrointestinal: Positive for rectal pain. Negative for abdominal pain, blood in stool, constipation and nausea.  Musculoskeletal: Negative.  Negative for myalgias.  Skin: Negative.  Negative for wound.  Neurological: Negative.     Physical Exam Updated Vital Signs BP 132/68 (BP Location: Right Arm)   Pulse 94   Temp 99.2 F (37.3 C) (Oral)   Resp 18   Ht 5\' 7"  (1.702 m)   Wt 61.2 kg   SpO2 95%   BMI 21.14 kg/m   Physical Exam Vitals and nursing note reviewed.  Cardiovascular:     Rate and Rhythm: Normal rate.  Pulmonary:     Effort: Pulmonary effort is normal.  Abdominal:     Tenderness: There is no abdominal tenderness.   Genitourinary:    Comments: Perirectal abscess that extends to right buttock. No redness or drainage. Exam limited by intolerance due to pain. Musculoskeletal:        General: Normal range of motion.  Skin:    General: Skin is warm and dry.  Neurological:     Mental Status: He is oriented to person, place, and time.     ED Results / Procedures / Treatments   Labs (all labs ordered are listed, but only abnormal results are displayed) Labs Reviewed  CBC WITH DIFFERENTIAL/PLATELET - Abnormal; Notable for the following components:      Result Value   WBC 16.1 (*)    Neutro Abs 13.5 (*)    Monocytes Absolute 1.5 (*)    Abs Immature Granulocytes 0.09 (*)    All other components within normal limits  COMPREHENSIVE METABOLIC PANEL  URINALYSIS, ROUTINE W REFLEX MICROSCOPIC   Results for orders placed or performed during the hospital encounter of 02/04/19  CBC with Differential  Result Value Ref Range   WBC 16.1 (H) 4.0 - 10.5 K/uL   RBC 4.96 4.22 - 5.81 MIL/uL   Hemoglobin 15.6 13.0 - 17.0 g/dL   HCT 04/04/19 67.8 - 93.8 %   MCV 93.5 80.0 - 100.0 fL   MCH 31.5 26.0 - 34.0 pg   MCHC 33.6 30.0 -  36.0 g/dL   RDW 89.1 69.4 - 50.3 %   Platelets 212 150 - 400 K/uL   nRBC 0.0 0.0 - 0.2 %   Neutrophils Relative % 84 %   Neutro Abs 13.5 (H) 1.7 - 7.7 K/uL   Lymphocytes Relative 6 %   Lymphs Abs 0.9 0.7 - 4.0 K/uL   Monocytes Relative 9 %   Monocytes Absolute 1.5 (H) 0.1 - 1.0 K/uL   Eosinophils Relative 0 %   Eosinophils Absolute 0.0 0.0 - 0.5 K/uL   Basophils Relative 0 %   Basophils Absolute 0.0 0.0 - 0.1 K/uL   Immature Granulocytes 1 %   Abs Immature Granulocytes 0.09 (H) 0.00 - 0.07 K/uL  Comprehensive metabolic panel  Result Value Ref Range   Sodium 141 135 - 145 mmol/L   Potassium 3.6 3.5 - 5.1 mmol/L   Chloride 104 98 - 111 mmol/L   CO2 27 22 - 32 mmol/L   Glucose, Bld 99 70 - 99 mg/dL   BUN 7 6 - 20 mg/dL   Creatinine, Ser 8.88 0.61 - 1.24 mg/dL   Calcium 9.6 8.9 -  28.0 mg/dL   Total Protein 7.7 6.5 - 8.1 g/dL   Albumin 4.2 3.5 - 5.0 g/dL   AST 19 15 - 41 U/L   ALT 16 0 - 44 U/L   Alkaline Phosphatase 47 38 - 126 U/L   Total Bilirubin 0.5 0.3 - 1.2 mg/dL   GFR calc non Af Amer >60 >60 mL/min   GFR calc Af Amer >60 >60 mL/min   Anion gap 10 5 - 15    EKG None  Radiology No results found.  Procedures .Marland KitchenIncision and Drainage  Date/Time: 02/05/2019 4:01 AM Performed by: Elpidio Anis, PA-C Authorized by: Elpidio Anis, PA-C   Consent:    Consent obtained:  Verbal   Consent given by:  Patient Location:    Type:  Abscess   Location:  Lower extremity   Lower extremity location:  Buttock   Buttock location:  R buttock Pre-procedure details:    Skin preparation:  Antiseptic wash Anesthesia (see MAR for exact dosages):    Anesthesia method:  Local infiltration   Local anesthetic:  Lidocaine 2% WITH epi Procedure details:    Needle aspiration: no     Incision types:  Single straight   Scalpel blade:  11   Wound management:  Probed and deloculated and irrigated with saline   Drainage:  Bloody   Wound treatment:  Wound left open Post-procedure details:    Patient tolerance of procedure:  Tolerated well, no immediate complications   (including critical care time)  Medications Ordered in ED Medications  morphine 4 MG/ML injection 4 mg (has no administration in time range)  piperacillin-tazobactam (ZOSYN) IVPB 3.375 g (has no administration in time range)    ED Course  I have reviewed the triage vital signs and the nursing notes.  Pertinent labs & imaging results that were available during my care of the patient were reviewed by me and considered in my medical decision making (see chart for details).    MDM Rules/Calculators/A&P                      Patient to ED with progressive, severe pain peri-rectally x 4 days.   Suspect peri-rectal abscess. CT scan pending. IV pain medication, Zosyn provided.   CT scan shows  subcutaneous fluid collections felt likely developing abscesses. No evidence of communication with rectum or anus.  I&D procedure per above note.   Will discharge home with Rx Doxycycline, Norco for pain. Strongly encouraged his to return in 2 days for recheck. Symptoms that should prompt earlier return also discussed.   Final Clinical Impression(s) / ED Diagnoses Final diagnoses:  None   1. Abscess right buttock  Rx / DC Orders ED Discharge Orders    None       Charlann Lange, PA-C 09/32/35 5732    Delora Fuel, MD 20/25/42 [phone]

## 2019-02-05 NOTE — ED Notes (Signed)
Patient transported to CT 

## 2019-02-05 NOTE — Discharge Instructions (Addendum)
Return to the ED in 2 days for recheck of abscess.   Return sooner with any high fever, increasing pain or drainage, or for new concern.

## 2019-02-07 ENCOUNTER — Emergency Department (HOSPITAL_COMMUNITY)
Admission: EM | Admit: 2019-02-07 | Discharge: 2019-02-08 | Disposition: A | Discharge status: Home / Self Care | Payer: Self-pay | Attending: Emergency Medicine | Admitting: Emergency Medicine

## 2019-02-07 ENCOUNTER — Encounter (HOSPITAL_COMMUNITY): Payer: Self-pay | Admitting: Emergency Medicine

## 2019-02-07 DIAGNOSIS — K611 Rectal abscess: Secondary | ICD-10-CM | POA: Insufficient documentation

## 2019-02-07 DIAGNOSIS — Z5189 Encounter for other specified aftercare: Secondary | ICD-10-CM

## 2019-02-07 DIAGNOSIS — Z4800 Encounter for change or removal of nonsurgical wound dressing: Secondary | ICD-10-CM | POA: Insufficient documentation

## 2019-02-07 NOTE — ED Triage Notes (Signed)
Pt states he was told to come back to ER in 2-3 days to have abscess on rectum reassessed. Pt states pain is much better and is able to walk a lot better.

## 2019-02-07 NOTE — ED Notes (Signed)
This tech was told by patient experience that the pt had left.

## 2019-02-07 NOTE — ED Notes (Signed)
Pt back from seeing his daughter.

## 2019-02-08 NOTE — ED Provider Notes (Signed)
[facility] EMERGENCY DEPARTMENT Provider Note   CSN: [id number] Arrival date & time: 02/07/19  1401     History Chief Complaint  Patient presents with  . Wound Check    Arthur Vaughn is a 29 y.o. male.  HPI     This is a 29 year old male who presents for wound check.  He was seen and evaluated 2 days ago.  At that time he had incision and drainage of a perirectal abscess.  He was started on doxycycline.  Today he has no complaints.  He states he feels much better.  He has noted minimal to no drainage from the abscess.  Pain is well controlled.  He has not had any systemic symptoms or fevers.  History reviewed. No pertinent past medical history.  There are no problems to display for this patient.   History reviewed. No pertinent surgical history.     No family history on file.  Social History   Tobacco Use  . Smoking status: Never Smoker  . Smokeless tobacco: Never Used  Substance Use Topics  . Alcohol use: Yes    Comment: occass  . Drug use: Yes    Types: Marijuana    Home Medications Prior to Admission medications   Medication Sig Start Date End Date Taking? Authorizing Provider  doxycycline (VIBRAMYCIN) 100 MG capsule Take 1 capsule (100 mg total) by mouth 2 (two) times daily. 02/05/19   Charlann Lange, PA-C  HYDROcodone-acetaminophen (NORCO/VICODIN) 5-325 MG tablet Take 1 tablet by mouth every 4 (four) hours as needed for moderate pain or severe pain. 02/05/19   Charlann Lange, PA-C  ibuprofen (ADVIL) 200 MG tablet Take 200 mg by mouth every 6 (six) hours as needed for mild pain.    [provider]    Allergies    Patient has no known allergies.  Review of Systems   Review of Systems  Constitutional: Negative for fever.  Gastrointestinal: Negative for blood in stool.  Skin: Negative for color change.  All other systems reviewed and are negative.   Physical Exam Updated Vital Signs BP 127/82 (BP Location: Left Arm)   Pulse 74    Temp 98.1 F (36.7 C) (Oral)   Resp 16   SpO2 98%   Physical Exam Vitals and nursing note reviewed.  Constitutional:      Appearance: He is well-developed. He is not ill-appearing.  HENT:     Head: Normocephalic and atraumatic.     Mouth/Throat:     Mouth: Mucous membranes are moist.  Cardiovascular:     Rate and Rhythm: Normal rate and regular rhythm.  Pulmonary:     Effort: Pulmonary effort is normal. No respiratory distress.  Genitourinary:    Comments: No fluctuance or erythema noted about the rectum, barely visible incision site clean without drainage Musculoskeletal:     Cervical back: Neck supple.  Skin:    General: Skin is warm and dry.  Neurological:     Mental Status: He is alert and oriented to person, place, and time.  Psychiatric:        Mood and Affect: Mood normal.     ED Results / Procedures / Treatments   Labs (all labs ordered are listed, but only abnormal results are displayed) Labs Reviewed - No data to display  EKG None  Radiology No results found.  Procedures Procedures (including critical care time)  Medications Ordered in ED Medications - No data to display  ED Course  I have reviewed the triage  vital signs and the nursing notes.  Pertinent labs & imaging results that were available during my care of the patient were reviewed by me and considered in my medical decision making (see chart for details).    MDM Rules/Calculators/A&P                      Patient presents for wound check.  Wound is well-healing and he is significantly improved.  Advised to continue doxycycline as prescribed.  Cleared to return to work.  After history, exam, and medical workup I feel the patient has been appropriately medically screened and is safe for discharge home. Pertinent diagnoses were discussed with the patient. Patient was given return precautions.  Final Clinical Impression(s) / ED Diagnoses Final diagnoses:  Wound check, abscess    Rx /  DC Orders ED Discharge Orders    None       Tywana Robotham, Mayer Masker, MD 02/08/19 0010

## 2019-02-08 NOTE — Discharge Instructions (Signed)
You were seen today for a wound check.  Continue your antibiotics.  Your wound is well-healing.  Follow-up as needed.

## 2019-04-04 ENCOUNTER — Other Ambulatory Visit: Payer: Self-pay

## 2019-04-04 ENCOUNTER — Emergency Department (HOSPITAL_COMMUNITY)
Admission: EM | Admit: 2019-04-04 | Discharge: 2019-04-04 | Discharge status: Against Medical Advice | Payer: Self-pay | Attending: Emergency Medicine | Admitting: Emergency Medicine

## 2019-04-04 ENCOUNTER — Encounter (HOSPITAL_COMMUNITY): Payer: Self-pay | Admitting: Emergency Medicine

## 2019-04-04 ENCOUNTER — Emergency Department (HOSPITAL_COMMUNITY)
Admission: EM | Admit: 2019-04-04 | Discharge: 2019-04-04 | Disposition: A | Discharge status: Home / Self Care | Payer: Self-pay | Attending: Emergency Medicine | Admitting: Emergency Medicine

## 2019-04-04 DIAGNOSIS — K611 Rectal abscess: Secondary | ICD-10-CM | POA: Insufficient documentation

## 2019-04-04 DIAGNOSIS — K6289 Other specified diseases of anus and rectum: Secondary | ICD-10-CM | POA: Insufficient documentation

## 2019-04-04 DIAGNOSIS — Z5321 Procedure and treatment not carried out due to patient leaving prior to being seen by health care provider: Secondary | ICD-10-CM | POA: Insufficient documentation

## 2019-04-04 MED ORDER — CLINDAMYCIN HCL 150 MG PO CAPS: 300.0000 mg | ORAL_CAPSULE | Freq: Once | ORAL | Status: DC

## 2019-04-04 NOTE — ED Notes (Signed)
Pt name called 3x  with no response. 

## 2019-04-04 NOTE — Discharge Instructions (Addendum)
Thank you for letting us take care of you today.  Is a summary of what we treated:  1.  Rectal pain -We did not see anything that would make [place] suspicious for a large abscess at this time. We gave you a one time dose of the antibiotic, Clindamycin in case there's a brewing infection.  2. Follow up  - If you start to get fevers, nausea, vomiting, this may be a sign that an infection has grown.  - If you do not have a primary care provider, please reach out to someone in the community to establish care.

## 2019-04-04 NOTE — ED Notes (Signed)
Pt was discharged from the ED. Pt conscious, breathing, and A&Ox4. No distress noted. Pt speaking in complete sentences. Pt ambulated out of the ED with a smooth and steady gait. Pt left before receiving discharge paperwork given.

## 2019-04-04 NOTE — ED Provider Notes (Addendum)
[facility] EMERGENCY DEPARTMENT Provider Note   CSN: [id number] Arrival date & time: 04/04/19  1758     History Chief Complaint  Patient presents with  . Abscess   Arthur Vaughn is a 29 y.o. male.  Arthur Vaughn is a 29 year old male with no significant past medical history who presents with concerns for a perianal abscess.  On 1/12 the patient had an ED visit where he had an anal abscess which was I&D 8 and given 10-day course of doxycycline.  This resolved all the patient's symptoms, however he states his symptoms returned about 3 days ago.  He currently endorses is 6/10 pain in his perianal area.  He states that he was concerned his abscess is returning, and wanted to get it checked out before he got as bad as it previously did.  He denies having any fevers, chills, draining pus, or any blood coming from his rectum.  Changes in his bowel or bladder habits.       No past medical history on file.  There are no problems to display for this patient.  No past surgical history on file.    No family history on file.  Social History   Tobacco Use  . Smoking status: Never Smoker  . Smokeless tobacco: Never Used  Substance Use Topics  . Alcohol use: Yes    Comment: occass  . Drug use: Yes    Types: Marijuana   Home Medications Prior to Admission medications   Medication Sig Start Date End Date Taking? Authorizing Provider  doxycycline (VIBRAMYCIN) 100 MG capsule Take 1 capsule (100 mg total) by mouth 2 (two) times daily. 02/05/19   Charlann Lange, PA-C  HYDROcodone-acetaminophen (NORCO/VICODIN) 5-325 MG tablet Take 1 tablet by mouth every 4 (four) hours as needed for moderate pain or severe pain. 02/05/19   Charlann Lange, PA-C  ibuprofen (ADVIL) 200 MG tablet Take 200 mg by mouth every 6 (six) hours as needed for mild pain.    [provider]   Allergies    Patient has no known allergies.  Review of Systems   Review of Systems  Constitutional:  Negative.   HENT: Negative.   Eyes: Negative.   Respiratory: Negative.   Cardiovascular: Negative.   Gastrointestinal: Negative.   Endocrine: Negative.   Genitourinary: Negative.   Musculoskeletal: Negative.   Skin: Negative.   Allergic/Immunologic: Negative.   Neurological: Negative.   Hematological: Negative.   Psychiatric/Behavioral: Negative.    Physical Exam Updated Vital Signs BP 119/77 (BP Location: Right Arm)   Pulse 79   Temp 97.9 F (36.6 C) (Oral)   Resp 18   Ht 5\' 7"  (1.702 m)   Wt 65.8 kg   SpO2 100%   BMI 22.71 kg/m   Physical Exam Constitutional:      General: He is not in acute distress.    Appearance: Normal appearance. He is normal weight. He is not ill-appearing, toxic-appearing or diaphoretic.  HENT:     Head: Normocephalic and atraumatic.  Eyes:     Extraocular Movements: Extraocular movements intact.  Pulmonary:     Effort: Pulmonary effort is normal.  Genitourinary:    Rectum: Normal.     Comments: No tenderness to palpation, no swelling erythema, or induration concerning for underlying abscess Musculoskeletal:     Right lower leg: No edema.     Left lower leg: No edema.  Skin:    General: Skin is warm.  Neurological:     General: No focal  deficit present.     Mental Status: He is alert and oriented to person, place, and time.  Psychiatric:        Mood and Affect: Mood normal.    ED Results / Procedures / Treatments   Labs (all labs ordered are listed, but only abnormal results are displayed) Labs Reviewed - No data to display  EKG None  Radiology No results found.  Procedures Procedures (including critical care time)  Medications Ordered in ED Medications - No data to display  ED Course  I have reviewed the triage vital signs and the nursing notes.  Pertinent labs & imaging results that were available during my care of the patient were reviewed by me and considered in my medical decision making (see chart for  details).    MDM Rules/Calculators/A&P                      Patient's physical exam was unremarkable.  It does not appear that the patient has an overt abscess at this time.  However, given the patient's 6/10 pain level, and feelings that this was the same presentation as his prior abscess, its possible we are seeing this in the early stages.  Plan to give a dose of clindamycin and counseled the patient to take Epson salt baths. Will discharge.  Final Clinical Impression(s) / ED Diagnoses Final diagnoses:  None   Rx / DC Orders ED Discharge Orders    None     Kirt Boys, MD 04/04/19 2117    Kirt Boys, MD 04/04/19 2124    Gerhard Munch, MD 04/04/19 2244

## 2019-04-04 NOTE — ED Triage Notes (Signed)
Pt reports having rectal abscess drained a month ago. Pt has another abscess.

## 2019-04-04 NOTE — ED Triage Notes (Signed)
Pt reports rectal abscess.

## 2021-01-10 ENCOUNTER — Encounter (HOSPITAL_COMMUNITY): Payer: Self-pay

## 2021-01-10 ENCOUNTER — Other Ambulatory Visit: Payer: Self-pay

## 2021-01-10 ENCOUNTER — Ambulatory Visit (HOSPITAL_COMMUNITY)
Admission: EM | Admit: 2021-01-10 | Discharge: 2021-01-10 | Disposition: A | Discharge status: Home / Self Care | Payer: Medicaid Other | Attending: Student | Admitting: Student

## 2021-01-10 DIAGNOSIS — Z20828 Contact with and (suspected) exposure to other viral communicable diseases: Secondary | ICD-10-CM | POA: Diagnosis not present

## 2021-01-10 DIAGNOSIS — J069 Acute upper respiratory infection, unspecified: Secondary | ICD-10-CM | POA: Diagnosis not present

## 2021-01-10 MED ORDER — ALBUTEROL SULFATE HFA 108 (90 BASE) MCG/ACT IN AERS: 1.0000 | INHALATION_SPRAY | Freq: Four times a day (QID) | RESPIRATORY_TRACT | 0 refills | Status: DC | PRN

## 2021-01-10 NOTE — Discharge Instructions (Addendum)
-  Albuterol inhaler as needed for cough, wheezing, shortness of breath, 1 to 2 puffs every 6 hours as needed. -You can continue over-the-counter medications like dayquil, nyquil, etc.  -You can take Tylenol up to 1000 mg 3 times daily, and ibuprofen up to 600 mg 3 times daily with food.  You can take these together, or alternate every 3-4 hours. -With a virus, you're typically contagious for 5-7 days, or as long as you're having fevers.

## 2021-01-10 NOTE — ED Provider Notes (Signed)
[employer]    CSN: [id number] Arrival date & time: 01/10/21  1306      History   Chief Complaint Chief Complaint  Patient presents with   Sore Throat   Cough    HPI Arthur Vaughn is a 30 y.o. male presenting with sore throat and cough x2 days. Medical history noncontributory. Describes sore throat, cough x2 days following exposure to his child who has RSV. Describes the cough as nonproductive but feels that there's a lot of congestion in his chest. Dayquil providing some relief. Denies SOB, CP, dizziness, weakness, fevers/chills, myalgias, n/v/d/c/abd pain. Denies history pulm ds but states it feels like he has asthma right now.  HPI  History reviewed. No pertinent past medical history.  There are no problems to display for this patient.   History reviewed. No pertinent surgical history.     Home Medications    Prior to Admission medications   Medication Sig Start Date End Date Taking? Authorizing Provider  albuterol (VENTOLIN HFA) 108 (90 Base) MCG/ACT inhaler Inhale 1-2 puffs into the lungs every 6 (six) hours as needed for wheezing or shortness of breath. 01/10/21  Yes Hazel Sams, PA-C  doxycycline (VIBRAMYCIN) 100 MG capsule Take 1 capsule (100 mg total) by mouth 2 (two) times daily. 02/05/19   Charlann Lange, PA-C  HYDROcodone-acetaminophen (NORCO/VICODIN) 5-325 MG tablet Take 1 tablet by mouth every 4 (four) hours as needed for moderate pain or severe pain. 02/05/19   Charlann Lange, PA-C  ibuprofen (ADVIL) 200 MG tablet Take 200 mg by mouth every 6 (six) hours as needed for mild pain.    [provider]    Family History History reviewed. No pertinent family history.  Social History Social History   Tobacco Use   Smoking status: Never   Smokeless tobacco: Never  Substance Use Topics   Alcohol use: Yes    Comment: occass   Drug use: Yes    Types: Marijuana     Allergies   Patient has no known allergies.   Review of  Systems Review of Systems  Constitutional:  Negative for appetite change, chills and fever.  HENT:  Positive for congestion. Negative for ear pain, rhinorrhea, sinus pressure, sinus pain and sore throat.   Eyes:  Negative for redness and visual disturbance.  Respiratory:  Positive for cough. Negative for chest tightness, shortness of breath and wheezing.   Cardiovascular:  Negative for chest pain and palpitations.  Gastrointestinal:  Negative for abdominal pain, constipation, diarrhea, nausea and vomiting.  Genitourinary:  Negative for dysuria, frequency and urgency.  Musculoskeletal:  Negative for myalgias.  Neurological:  Negative for dizziness, weakness and headaches.  Psychiatric/Behavioral:  Negative for confusion.   All other systems reviewed and are negative.   Physical Exam Triage Vital Signs ED Triage Vitals  Enc Vitals Group     BP 01/10/21 1411 134/88     Pulse Rate 01/10/21 1411 72     Resp 01/10/21 1411 18     Temp 01/10/21 1411 98.6 F (37 C)     Temp Source 01/10/21 1411 Oral     SpO2 01/10/21 1411 100 %     Weight --      Height --      Head Circumference --      Peak Flow --      Pain Score 01/10/21 1412 0     Pain Loc --      Pain Edu? --      Excl. in  GC? --    No data found.  Updated Vital Signs BP 134/88 (BP Location: Left Arm)    Pulse 72    Temp 98.6 F (37 C) (Oral)    Resp 18    SpO2 100%   Visual Acuity Right Eye Distance:   Left Eye Distance:   Bilateral Distance:    Right Eye Near:   Left Eye Near:    Bilateral Near:     Physical Exam Vitals reviewed.  Constitutional:      General: He is not in acute distress.    Appearance: Normal appearance. He is ill-appearing.  HENT:     Head: Normocephalic and atraumatic.     Right Ear: Tympanic membrane, ear canal and external ear normal. No tenderness. No middle ear effusion. There is no impacted cerumen. Tympanic membrane is not perforated, erythematous, retracted or bulging.     Left Ear:  Tympanic membrane, ear canal and external ear normal. No tenderness.  No middle ear effusion. There is no impacted cerumen. Tympanic membrane is not perforated, erythematous, retracted or bulging.     Nose: Nose normal. No congestion.     Mouth/Throat:     Mouth: Mucous membranes are moist.     Pharynx: Uvula midline. No oropharyngeal exudate or posterior oropharyngeal erythema.  Eyes:     Extraocular Movements: Extraocular movements intact.     Pupils: Pupils are equal, round, and reactive to light.  Cardiovascular:     Rate and Rhythm: Normal rate and regular rhythm.     Heart sounds: Normal heart sounds.  Pulmonary:     Effort: Pulmonary effort is normal.     Breath sounds: Normal breath sounds. No decreased breath sounds, wheezing, rhonchi or rales.  Abdominal:     Palpations: Abdomen is soft.     Tenderness: There is no abdominal tenderness. There is no guarding or rebound.  Lymphadenopathy:     Cervical: No cervical adenopathy.     Right cervical: No superficial cervical adenopathy.    Left cervical: No superficial cervical adenopathy.  Neurological:     General: No focal deficit present.     Mental Status: He is alert and oriented to person, place, and time.  Psychiatric:        Mood and Affect: Mood normal.        Behavior: Behavior normal.        Thought Content: Thought content normal.        Judgment: Judgment normal.     UC Treatments / Results  Labs (all labs ordered are listed, but only abnormal results are displayed) Labs Reviewed - No data to display  EKG   Radiology No results found.  Procedures Procedures (including critical care time)  Medications Ordered in UC Medications - No data to display  Initial Impression / Assessment and Plan / UC Course  I have reviewed the triage vital signs and the nursing notes.  Pertinent labs & imaging results that were available during my care of the patient were reviewed by me and considered in my medical  decision making (see chart for details).     This patient is a very pleasant 30 y.o. year old male presenting with suspected RSV following exposure to this. Today this pt is afebrile nontachycardic nontachypneic, oxygenating well on room air, no wheezes rhonchi or rales. Has not taken antipyretic. Denies history cardiopulm ds. Exposure to RSV.  Trial of albuterol inhaler. Continue OTC medications like Mucinex, Dayquil, etc.   Work notes provided.  ED return precautions discussed. Patient verbalizes understanding and agreement.      Final Clinical Impressions(s) / UC Diagnoses   Final diagnoses:  RSV exposure  Viral URI with cough     Discharge Instructions      -Albuterol inhaler as needed for cough, wheezing, shortness of breath, 1 to 2 puffs every 6 hours as needed. -You can continue over-the-counter medications like dayquil, nyquil, etc.  -You can take Tylenol up to 1000 mg 3 times daily, and ibuprofen up to 600 mg 3 times daily with food.  You can take these together, or alternate every 3-4 hours. -With a virus, you're typically contagious for 5-7 days, or as long as you're having fevers.       ED Prescriptions     Medication Sig Dispense Auth. Provider   albuterol (VENTOLIN HFA) 108 (90 Base) MCG/ACT inhaler Inhale 1-2 puffs into the lungs every 6 (six) hours as needed for wheezing or shortness of breath. 1 each Hazel Sams, PA-C      PDMP not reviewed this encounter.   Hazel Sams, PA-C 01/10/21 1430

## 2021-01-10 NOTE — ED Triage Notes (Signed)
Pt presents to the office for cough and sore throat x 2 days. He is taking nyquil for illness.

## 2021-02-07 ENCOUNTER — Ambulatory Visit (HOSPITAL_COMMUNITY)
Admission: EM | Admit: 2021-02-07 | Discharge: 2021-02-07 | Disposition: A | Discharge status: Home / Self Care | Payer: BC Managed Care – PPO | Attending: Physician Assistant | Admitting: Physician Assistant

## 2021-02-07 ENCOUNTER — Other Ambulatory Visit: Payer: Self-pay

## 2021-02-07 ENCOUNTER — Encounter (HOSPITAL_COMMUNITY): Payer: Self-pay | Admitting: Emergency Medicine

## 2021-02-07 DIAGNOSIS — R52 Pain, unspecified: Secondary | ICD-10-CM | POA: Diagnosis present

## 2021-02-07 DIAGNOSIS — U071 COVID-19: Secondary | ICD-10-CM | POA: Insufficient documentation

## 2021-02-07 DIAGNOSIS — F17210 Nicotine dependence, cigarettes, uncomplicated: Secondary | ICD-10-CM | POA: Insufficient documentation

## 2021-02-07 DIAGNOSIS — R051 Acute cough: Secondary | ICD-10-CM | POA: Diagnosis present

## 2021-02-07 DIAGNOSIS — J069 Acute upper respiratory infection, unspecified: Secondary | ICD-10-CM | POA: Diagnosis present

## 2021-02-07 DIAGNOSIS — Z20822 Contact with and (suspected) exposure to covid-19: Secondary | ICD-10-CM | POA: Diagnosis not present

## 2021-02-07 MED ORDER — PROMETHAZINE-DM 6.25-15 MG/5ML PO SYRP: 5.0000 mL | ORAL_SOLUTION | Freq: Three times a day (TID) | ORAL | 0 refills | Status: DC | PRN

## 2021-02-07 NOTE — ED Provider Notes (Signed)
[facility]    CSN: [id number] Arrival date & time: 02/07/21  1438      History   Chief Complaint Chief Complaint  Patient presents with   Covid Positive    HPI Arthur Vaughn is a 31 y.o. male.   Patient presents today with a 4 to 5-day history of URI symptoms.  Reports nasal congestion, cough, intermittent shortness of breath, body aches, fatigue, malaise.  Denies any fever, chest pain, nausea, vomiting.  He has not had COVID-19 vaccination and has not had COVID in the past.  Reports that his fiance tested positive for COVID-19 so he took an at-home COVID test that was positive earlier today.  Unfortunately, his work would not accept the studies requesting repeat testing.  He has not been using anything other than NyQuil for symptom management.  He denies any significant past medical history including allergies, asthma, COPD, immunosuppression, malignancy, chronic liver/kidney disease.  He does smoke.   History reviewed. No pertinent past medical history.  There are no problems to display for this patient.   History reviewed. No pertinent surgical history.     Home Medications    Prior to Admission medications   Medication Sig Start Date End Date Taking? Authorizing Provider  promethazine-dextromethorphan (PROMETHAZINE-DM) 6.25-15 MG/5ML syrup Take 5 mLs by mouth 3 (three) times daily as needed for cough. 02/07/21  Yes Alisha Burgo K, PA-C  albuterol (VENTOLIN HFA) 108 (90 Base) MCG/ACT inhaler Inhale 1-2 puffs into the lungs every 6 (six) hours as needed for wheezing or shortness of breath. 01/10/21   Rhys MartiniGraham, Laura E, PA-C  ibuprofen (ADVIL) 200 MG tablet Take 200 mg by mouth every 6 (six) hours as needed for mild pain.    [provider]    Family History History reviewed. No pertinent family history.  Social History Social History   Tobacco Use   Smoking status: Never   Smokeless tobacco: Never  Substance Use Topics   Alcohol use: Yes     Comment: occass   Drug use: Yes    Types: Marijuana     Allergies   Patient has no known allergies.   Review of Systems Review of Systems  Constitutional:  Positive for activity change and fatigue. Negative for appetite change and fever.  HENT:  Positive for congestion and sore throat. Negative for sinus pressure and sneezing.   Respiratory:  Positive for cough and shortness of breath.   Cardiovascular:  Negative for chest pain.  Gastrointestinal:  Negative for abdominal pain, diarrhea, nausea and vomiting.  Musculoskeletal:  Positive for arthralgias and myalgias.  Neurological:  Positive for headaches. Negative for dizziness and light-headedness.    Physical Exam Triage Vital Signs ED Triage Vitals  Enc Vitals Group     BP 02/07/21 1523 124/84     Pulse Rate 02/07/21 1523 75     Resp 02/07/21 1523 16     Temp 02/07/21 1523 98.1 F (36.7 C)     Temp Source 02/07/21 1523 Oral     SpO2 02/07/21 1523 97 %     Weight --      Height --      Head Circumference --      Peak Flow --      Pain Score 02/07/21 1522 0     Pain Loc --      Pain Edu? --      Excl. in GC? --    No data found.  Updated Vital Signs BP 124/84 (BP Location:  Right Arm)    Pulse 75    Temp 98.1 F (36.7 C) (Oral)    Resp 16    SpO2 97%   Visual Acuity Right Eye Distance:   Left Eye Distance:   Bilateral Distance:    Right Eye Near:   Left Eye Near:    Bilateral Near:     Physical Exam Vitals reviewed.  Constitutional:      General: He is awake.     Appearance: Normal appearance. He is well-developed. He is not ill-appearing.     Comments: Very pleasant male appears stated age in no acute distress sitting comfortably in exam room  HENT:     Head: Normocephalic and atraumatic.     Right Ear: Tympanic membrane, ear canal and external ear normal. Tympanic membrane is not erythematous or bulging.     Left Ear: Tympanic membrane, ear canal and external ear normal. Tympanic membrane is not  erythematous or bulging.     Nose: Nose normal.     Mouth/Throat:     Pharynx: Uvula midline. Posterior oropharyngeal erythema present. No oropharyngeal exudate or uvula swelling.  Cardiovascular:     Rate and Rhythm: Normal rate and regular rhythm.     Heart sounds: Normal heart sounds, S1 normal and S2 normal. No murmur heard. Pulmonary:     Effort: Pulmonary effort is normal. No accessory muscle usage or respiratory distress.     Breath sounds: Normal breath sounds. No stridor. No wheezing, rhonchi or rales.     Comments: Clear to auscultation bilaterally Abdominal:     General: Bowel sounds are normal.     Palpations: Abdomen is soft.     Tenderness: There is no abdominal tenderness.  Neurological:     Mental Status: He is alert.  Psychiatric:        Behavior: Behavior is cooperative.     UC Treatments / Results  Labs (all labs ordered are listed, but only abnormal results are displayed) Labs Reviewed  SARS CORONAVIRUS 2 (TAT 6-24 HRS)    EKG   Radiology No results found.  Procedures Procedures (including critical care time)  Medications Ordered in UC Medications - No data to display  Initial Impression / Assessment and Plan / UC Course  I have reviewed the triage vital signs and the nursing notes.  Pertinent labs & imaging results that were available during my care of the patient were reviewed by me and considered in my medical decision making (see chart for details).     Discussed that symptoms are likely related to COVID given positive at-home test.  Patient is requesting repeat testing in clinic today for employment purposes and this was done.  He was encouraged to follow results on MyChart.  He is outside the window of effectiveness for antiviral medication and is at low risk for complications given no significant past medical history outside of smoking.  We will treat symptomatically with Promethazine DM.  He was instructed not to drive or drink alcohol with  this medication as drowsiness is a common side effect.  Recommended he alternate Tylenol ibuprofen for pain.  He is to rest and drink plenty of fluid.  He was provided work excuse note with current CDC return to work guidelines based on COVID testing result.  Discussed that if he has any alarm symptoms including fever, chest pain, shortness of breath, nausea/vomiting interfering with oral intake he needs to be reevaluated immediately.  Strict return precautions given to which he expressed understanding.  Final Clinical Impressions(s) / UC Diagnoses   Final diagnoses:  Upper respiratory tract infection, unspecified type  Acute cough  Body aches     Discharge Instructions      We will contact you if you are positive for COVID.  Please remain in isolation until you receive these results.  Sign up for MyChart to follow results if interested.  Alternate Tylenol and ibuprofen for fever and pain.  Use Promethazine DM for cough.  This can make you sleepy so do not drive or drink alcohol with taking it.  You can use Mucinex and Flonase for additional symptom relief.  Make sure you rest and drink plenty of fluid.  If you have any worsening symptoms including shortness of breath, chest pain, fever responding to medication, nausea/vomiting interfering with oral intake you need to go to be seen immediately.     ED Prescriptions     Medication Sig Dispense Auth. Provider   promethazine-dextromethorphan (PROMETHAZINE-DM) 6.25-15 MG/5ML syrup Take 5 mLs by mouth 3 (three) times daily as needed for cough. 118 mL Lashawn Orrego K, PA-C      PDMP not reviewed this encounter.   Jeani Hawking, PA-C 02/07/21 1625

## 2021-02-07 NOTE — Discharge Instructions (Signed)
We will contact you if you are positive for COVID.  Please remain in isolation until you receive these results.  Sign up for MyChart to follow results if interested.  Alternate Tylenol and ibuprofen for fever and pain.  Use Promethazine DM for cough.  This can make you sleepy so do not drive or drink alcohol with taking it.  You can use Mucinex and Flonase for additional symptom relief.  Make sure you rest and drink plenty of fluid.  If you have any worsening symptoms including shortness of breath, chest pain, fever responding to medication, nausea/vomiting interfering with oral intake you need to go to be seen immediately.

## 2021-02-07 NOTE — ED Triage Notes (Signed)
Pt reports that his fiance sick over the weekend and had a covid+ test. Since he had symptoms since Tuesday took test and was positive.

## 2021-02-08 LAB — SARS CORONAVIRUS 2 (TAT 6-24 HRS): SARS Coronavirus 2: POSITIVE — AB

## 2021-06-28 IMAGING — CT CT PELVIS W/ CM
2 of 3 series · 17 of 46 positions shown, 19 images · IV contrast (APPLIED)
Comparison: None.

CLINICAL DATA: 28-year-old male with perianal abscess.

EXAM:
CT PELVIS WITH CONTRAST
TECHNIQUE: Multidetector CT imaging of the pelvis was performed using the
standard protocol following the bolus administration of intravenous
contrast.
CONTRAST:  100mL OMNIPAQUE IOHEXOL 300 MG/ML  SOLN

[Series 5: soft tissue · axial · 0.80mm/px · z∈[+819,+1107]mm · 14 of 166 slices shown, 16 images]
[im 11/166  soft-tissue]
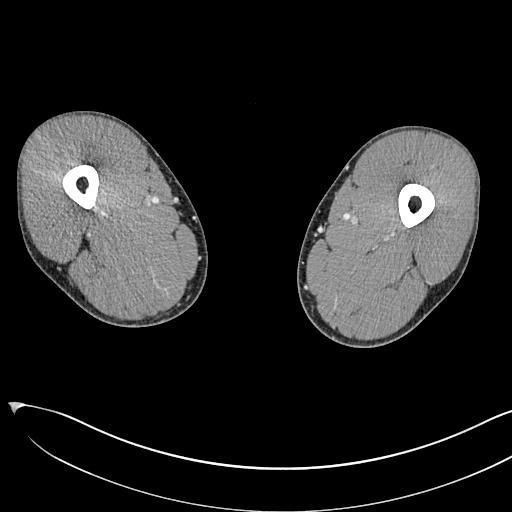
[im 11/166  bone]
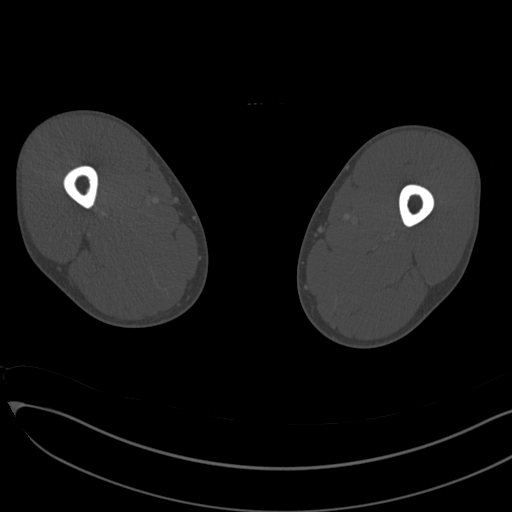
[im 22/166  soft-tissue]
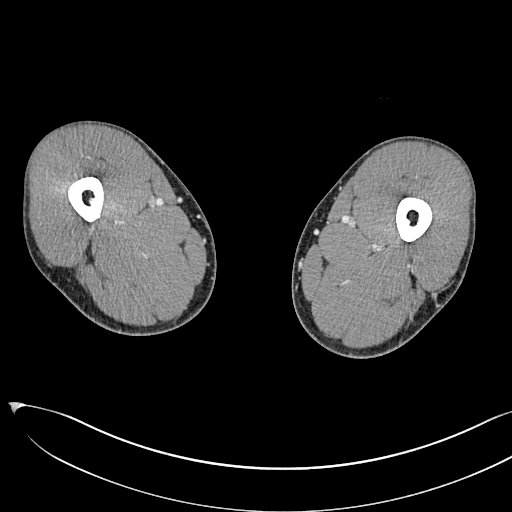
[im 32/166  soft-tissue]
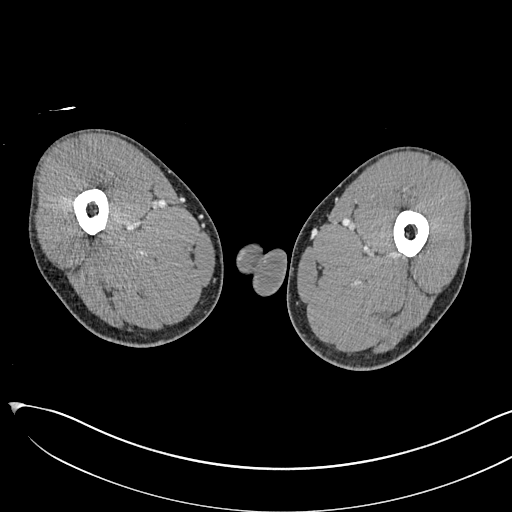
[im 43/166  soft-tissue]
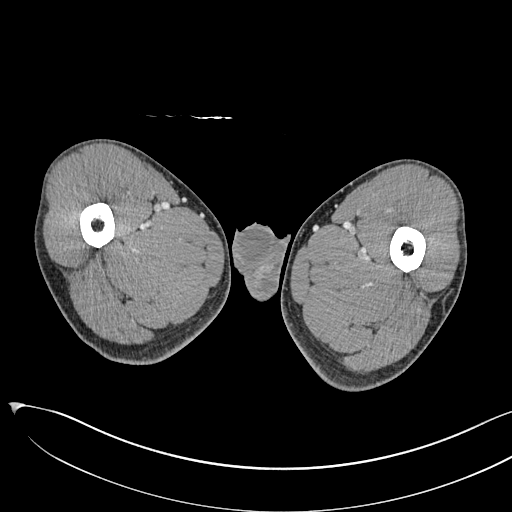
[im 54/166  soft-tissue]
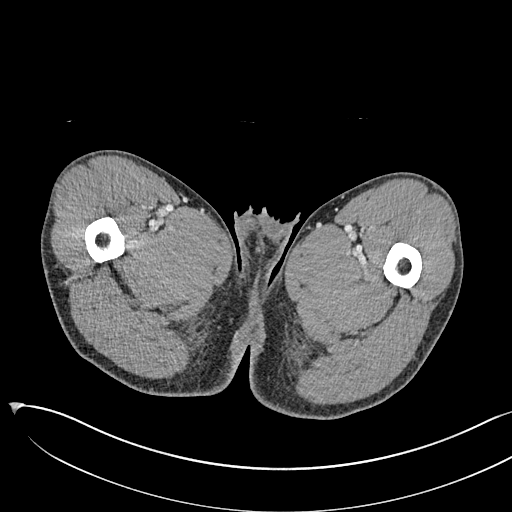
[im 64/166  soft-tissue]
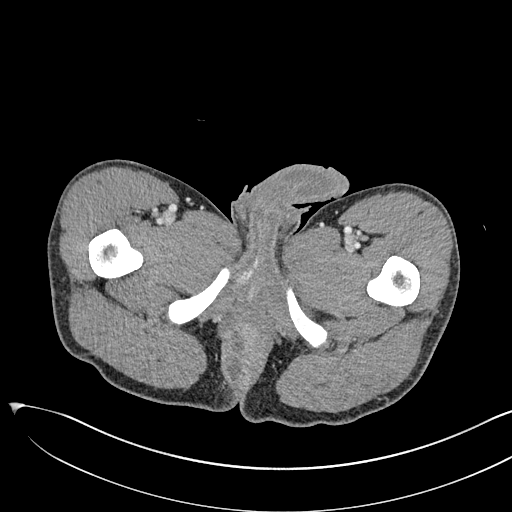
[im 75/166  soft-tissue]
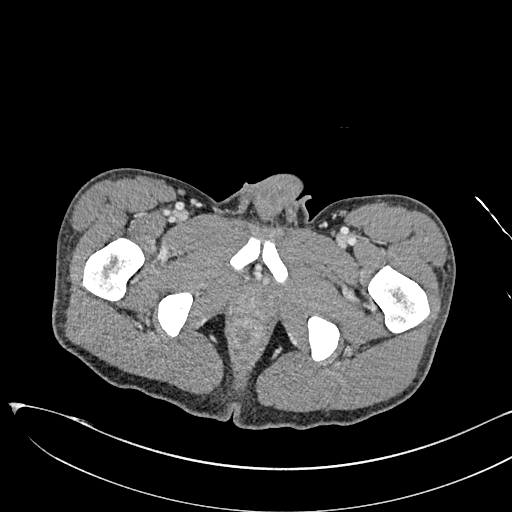
[im 91/166  soft-tissue]
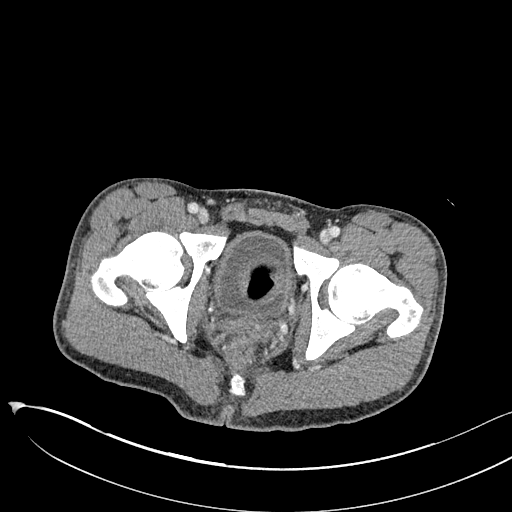
[im 102/166  soft-tissue]
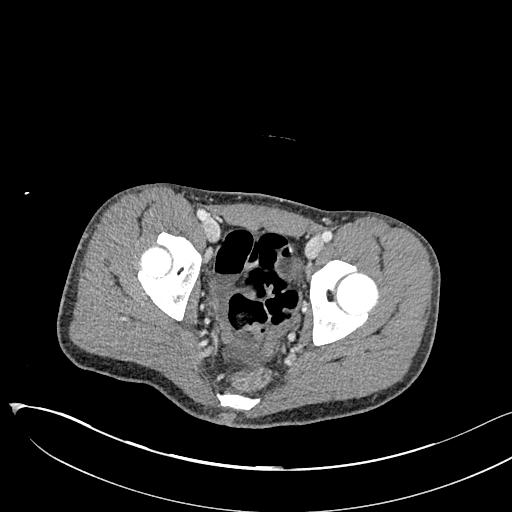
[im 102/166  bone]
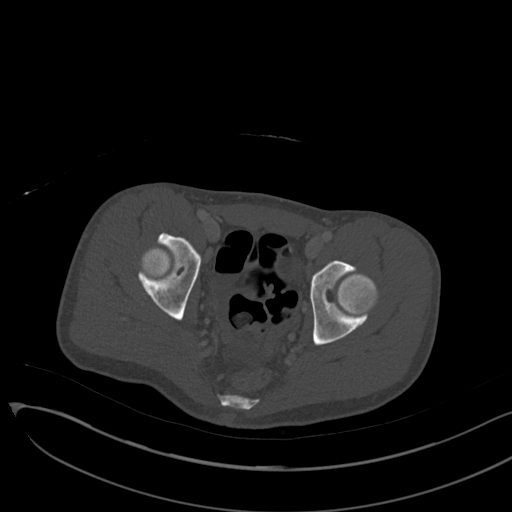
[im 112/166  soft-tissue]
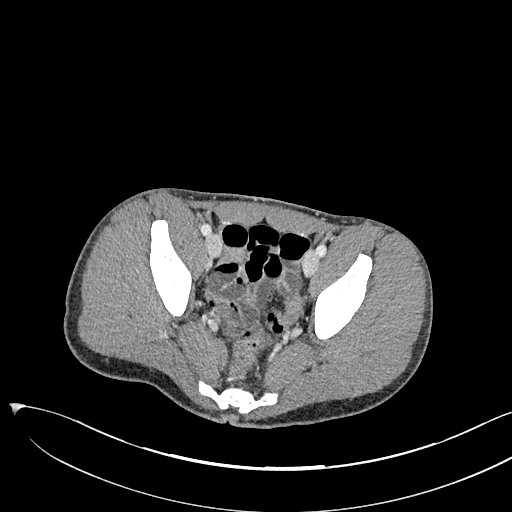
[im 123/166  soft-tissue]
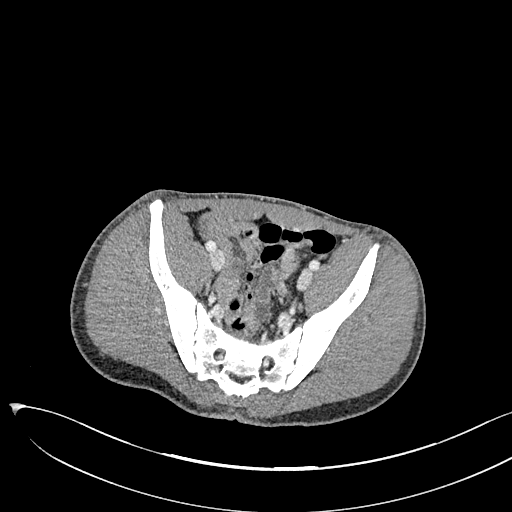
[im 134/166  soft-tissue]
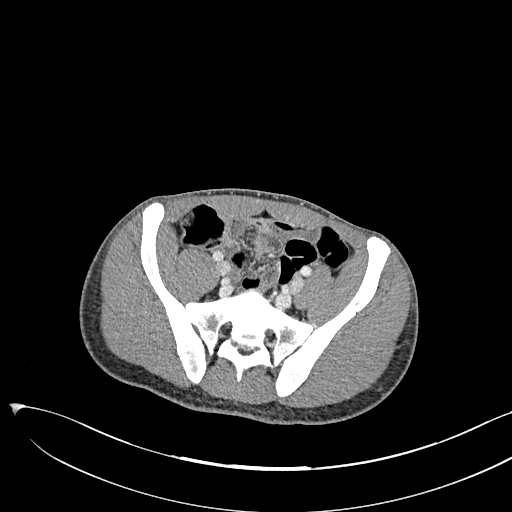
[im 144/166  soft-tissue]
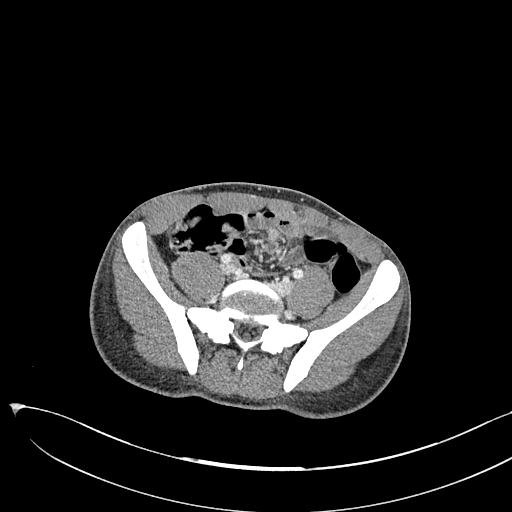
[im 155/166  soft-tissue]
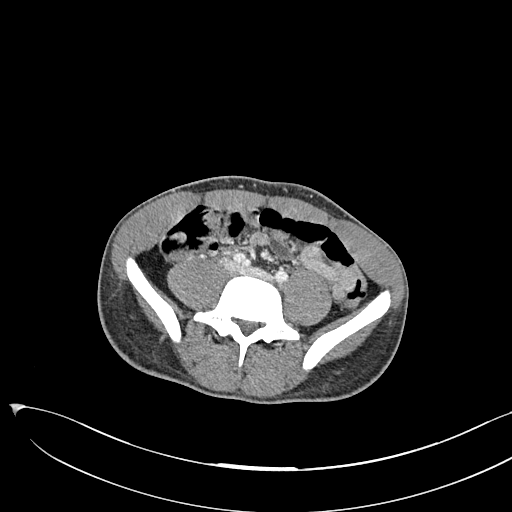

[Series 6: cor soft · coronal · 0.66mm/px · 3 of 140 slices shown]
[im 47/140  soft-tissue]
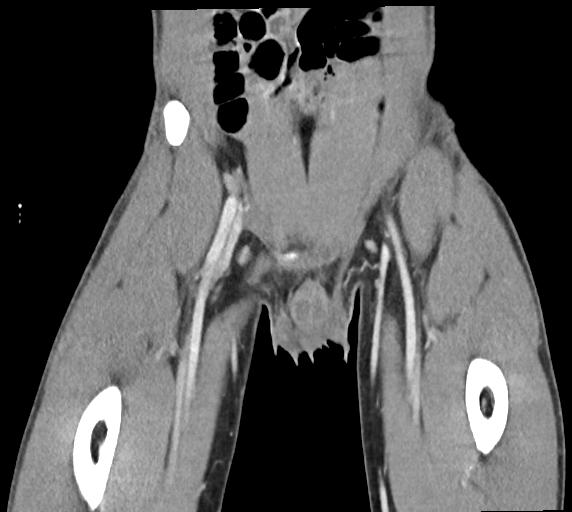
[im 62/140  soft-tissue]
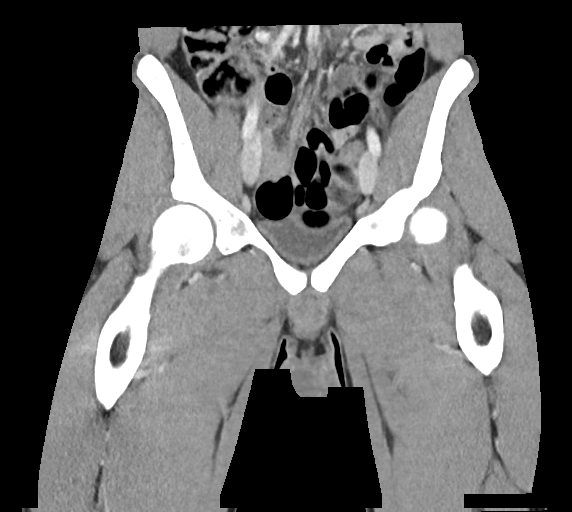
[im 78/140  soft-tissue]
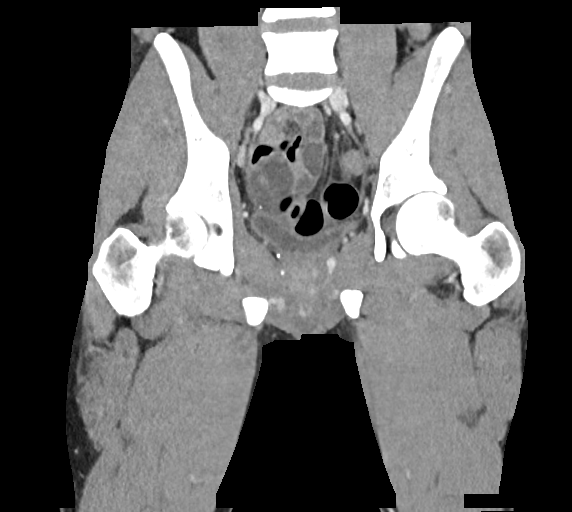

[17 of 46 positions shown; findings below may reference images not displayed]

FINDINGS: Urinary Tract:  No abnormality visualized.

Bowel:  Unremarkable visualized pelvic bowel loops.

Vascular/Lymphatic: No pathologically enlarged lymph nodes. No
significant vascular abnormality seen.

Reproductive:  No mass or other significant abnormality

Other: There is inflammatory changes and soft tissue thickening of
the posterior perianal region and along the intergluteal cleft.
There are 2 loculated appearing fluid collections in the
subcutaneous soft tissues of the right buttock along the
intergluteal fissure with combined dimension of approximately 5.2 x
2.6 cm consistent with phlegmon or developing abscesses.

Musculoskeletal: No suspicious bone lesions identified.
IMPRESSION: Inflammatory changes of the posterior perianal region with two
developing abscesses in the subcutaneous soft tissues of the right
buttock along the intergluteal fissure.

## 2022-11-28 ENCOUNTER — Encounter (HOSPITAL_COMMUNITY): Payer: Self-pay | Admitting: *Deleted

## 2022-11-28 ENCOUNTER — Other Ambulatory Visit: Payer: Self-pay

## 2022-11-28 ENCOUNTER — Emergency Department (HOSPITAL_COMMUNITY)
Admission: EM | Admit: 2022-11-28 | Discharge: 2022-11-28 | Disposition: A | Discharge status: Home / Self Care | Payer: Medicaid Other | Attending: Emergency Medicine | Admitting: Emergency Medicine

## 2022-11-28 DIAGNOSIS — K0889 Other specified disorders of teeth and supporting structures: Secondary | ICD-10-CM | POA: Insufficient documentation

## 2022-11-28 MED ORDER — NAPROXEN 500 MG PO TABS: 500.0000 mg | ORAL_TABLET | Freq: Two times a day (BID) | ORAL | 0 refills | Status: DC

## 2022-11-28 MED ORDER — CLINDAMYCIN HCL 150 MG PO CAPS
300.0000 mg | ORAL_CAPSULE | Freq: Once | ORAL | Status: AC
  Administered 2022-11-28: 300 mg via ORAL
  Filled 2022-11-28: qty 2

## 2022-11-28 MED ORDER — CLINDAMYCIN HCL 300 MG PO CAPS: 300.0000 mg | ORAL_CAPSULE | Freq: Three times a day (TID) | ORAL | 0 refills | Status: AC

## 2022-11-28 MED ORDER — IBUPROFEN 800 MG PO TABS
800.0000 mg | ORAL_TABLET | Freq: Once | ORAL | Status: AC
  Administered 2022-11-28: 800 mg via ORAL
  Filled 2022-11-28: qty 1

## 2022-11-28 MED ORDER — ONDANSETRON 4 MG PO TBDP
4.0000 mg | ORAL_TABLET | Freq: Once | ORAL | Status: AC
  Administered 2022-11-28: 4 mg via ORAL
  Filled 2022-11-28: qty 1

## 2022-11-28 NOTE — Discharge Instructions (Addendum)
Return if any problems.

## 2022-11-28 NOTE — ED Triage Notes (Signed)
The pt has had a headache for 2 weeks and he reports that he always has a headache in the winter time.  He also wants to be seen for a sore chin  he has a beard over his chin\  also for 2 weeks

## 2022-11-28 NOTE — ED Provider Notes (Signed)
[facility] Provider Note   CSN: [id number] Arrival date & time: 11/28/22  7253     History  Chief Complaint  Patient presents with   Headache    Arthur Vaughn is a 32 y.o. male.  Patient complains of a toothache and swelling to his mouth.  Patient also complains of a headache.  Patient reports he does not have a dentist.  He has had headaches in the past.  Patient reports he does not have any medical problems he denies any fever or chills.  Patient denies any difficulty swallowing he is not having any throat pain he denies any shortness of breath.  The history is provided by the patient.  Headache      Home Medications Prior to Admission medications   Medication Sig Start Date End Date Taking? Authorizing Provider  clindamycin (CLEOCIN) 300 MG capsule Take 1 capsule (300 mg total) by mouth 3 (three) times daily for 10 days. 11/28/22 12/08/22 Yes Elson Areas, PA-C  naproxen (NAPROSYN) 500 MG tablet Take 1 tablet (500 mg total) by mouth 2 (two) times daily with a meal. 11/28/22 11/28/23 Yes Elson Areas, PA-C  albuterol (VENTOLIN HFA) 108 (90 Base) MCG/ACT inhaler Inhale 1-2 puffs into the lungs every 6 (six) hours as needed for wheezing or shortness of breath. 01/10/21   Rhys Martini, PA-C  ibuprofen (ADVIL) 200 MG tablet Take 200 mg by mouth every 6 (six) hours as needed for mild pain.    [provider]  promethazine-dextromethorphan (PROMETHAZINE-DM) 6.25-15 MG/5ML syrup Take 5 mLs by mouth 3 (three) times daily as needed for cough. 02/07/21   Raspet, Noberto Retort, PA-C      Allergies    Patient has no known allergies.    Review of Systems   Review of Systems  Neurological:  Positive for headaches.  All other systems reviewed and are negative.   Physical Exam Updated Vital Signs BP 100/62   Pulse 97   Temp 98.5 F (36.9 C) (Oral)   Resp 16   Ht 5\' 7"  (1.702 m)   Wt 65.8 kg   SpO2 99%   BMI 22.72 kg/m   Physical Exam Vitals and nursing note reviewed.  Constitutional:      Appearance: He is well-developed.  HENT:     Head: Normocephalic.     Mouth/Throat:     Comments: Swelling gumline dental decay. Eyes:     Extraocular Movements: Extraocular movements intact.  Cardiovascular:     Rate and Rhythm: Normal rate.  Pulmonary:     Effort: Pulmonary effort is normal.  Abdominal:     General: There is no distension.  Musculoskeletal:        General: Normal range of motion.     Cervical back: Normal range of motion.  Skin:    General: Skin is warm.  Neurological:     General: No focal deficit present.     Mental Status: He is alert and oriented to person, place, and time.  Psychiatric:        Mood and Affect: Mood normal.     ED Results / Procedures / Treatments   Labs (all labs ordered are listed, but only abnormal results are displayed) Labs Reviewed - No data to display  EKG None  Radiology No results found.  Procedures Procedures    Medications Ordered in ED Medications  clindamycin (CLEOCIN) capsule 300 mg (has no administration in time range)  ondansetron (ZOFRAN-ODT) disintegrating  tablet 4 mg (has no administration in time range)  ibuprofen (ADVIL) tablet 800 mg (has no administration in time range)    ED Course/ Medical Decision Making/ A&P                                 Medical Decision Making Patient complains of a toothache and headache.  Patient does not have a dentist  Risk Prescription drug management. Risk Details: Counseled on the need to follow-up with dentistry he is given a prescription for clindamycin and Naprosyn.  He is given the phone number for Dr. Garvin Fila dentist on-call.           Final Clinical Impression(s) / ED Diagnoses Final diagnoses:  Toothache    Rx / DC Orders ED Discharge Orders          Ordered    clindamycin (CLEOCIN) 300 MG capsule  3 times daily        11/28/22 0845    naproxen (NAPROSYN) 500 MG  tablet  2 times daily with meals        11/28/22 0845           An After Visit Summary was printed and given to the patient.    Elson Areas, [place] 11/28/22 0848    Tegeler, Canary Brim, MD 11/28/22 [phone]

## 2022-12-13 ENCOUNTER — Ambulatory Visit (HOSPITAL_COMMUNITY)
Admission: EM | Admit: 2022-12-13 | Discharge: 2022-12-13 | Disposition: A | Discharge status: Home / Self Care | Payer: Medicaid Other | Attending: Family Medicine | Admitting: Family Medicine

## 2022-12-13 ENCOUNTER — Ambulatory Visit (INDEPENDENT_AMBULATORY_CARE_PROVIDER_SITE_OTHER): Payer: Medicaid Other

## 2022-12-13 ENCOUNTER — Encounter (HOSPITAL_COMMUNITY): Payer: Self-pay | Admitting: Emergency Medicine

## 2022-12-13 ENCOUNTER — Other Ambulatory Visit: Payer: Self-pay

## 2022-12-13 DIAGNOSIS — M79672 Pain in left foot: Secondary | ICD-10-CM

## 2022-12-13 MED ORDER — KETOROLAC TROMETHAMINE 10 MG PO TABS: 10.0000 mg | ORAL_TABLET | Freq: Four times a day (QID) | ORAL | 0 refills | Status: DC | PRN

## 2022-12-13 MED ORDER — KETOROLAC TROMETHAMINE 30 MG/ML IJ SOLN
INTRAMUSCULAR | Status: AC
  Filled 2022-12-13: qty 1

## 2022-12-13 MED ORDER — KETOROLAC TROMETHAMINE 30 MG/ML IJ SOLN
30.0000 mg | Freq: Once | INTRAMUSCULAR | Status: AC
  Administered 2022-12-13: 30 mg via INTRAMUSCULAR

## 2022-12-13 NOTE — ED Triage Notes (Signed)
Yesterday, was playing basketball,.  Patient reports he heard and felt a pop in left foot. Immediately he fell to the ground per patient .   Intermittently it is very painful to bear weight on this foot.    Has not had any medications for symptoms.  Pain in lateral foot, close to ankle/top of foot

## 2022-12-13 NOTE — Discharge Instructions (Addendum)
There were no broken bones on your x-ray.  You have been given a shot of Toradol 30 mg today.  Ketorolac 10 mg tablets--take 1 tablet every 6 hours as needed for pain.  This is the same medicine that is in the shot we just gave you  Ice and elevate your foot when you can.

## 2022-12-13 NOTE — ED Provider Notes (Signed)
[facility]    CSN: [id number] Arrival date & time: 12/13/22  1637      History   Chief Complaint Chief Complaint  Patient presents with   Foot Pain    HPI Arthur Vaughn is a 32 y.o. male.    Foot Pain  Here for left foot pain.  Yesterday he was playing basketball with some of his nephews and friends and turned his ankle and heard and felt a pop.  It hurts now anterior to the lateral malleolus on the left foot.  No allergies to medications    History reviewed. No pertinent past medical history.  There are no problems to display for this patient.   History reviewed. No pertinent surgical history.     Home Medications    Prior to Admission medications   Medication Sig Start Date End Date Taking? Authorizing Provider  ketorolac (TORADOL) 10 MG tablet Take 1 tablet (10 mg total) by mouth every 6 (six) hours as needed (pain). 12/13/22  Yes Zenia Resides, MD  albuterol (VENTOLIN HFA) 108 (90 Base) MCG/ACT inhaler Inhale 1-2 puffs into the lungs every 6 (six) hours as needed for wheezing or shortness of breath. 01/10/21   Rhys Martini, PA-C    Family History History reviewed. No pertinent family history.  Social History Social History   Tobacco Use   Smoking status: Some Days    Types: Cigarettes   Smokeless tobacco: Never  Vaping Use   Vaping status: Former  Substance Use Topics   Alcohol use: Yes    Comment: occass   Drug use: Not Currently    Types: Marijuana     Allergies   Patient has no known allergies.   Review of Systems Review of Systems   Physical Exam Triage Vital Signs ED Triage Vitals  Encounter Vitals Group     BP 12/13/22 1758 108/68     Systolic BP Percentile --      Diastolic BP Percentile --      Pulse Rate 12/13/22 1758 78     Resp 12/13/22 1758 18     Temp 12/13/22 1758 98.6 F (37 C)     Temp Source 12/13/22 1758 Oral     SpO2 12/13/22 1758 97 %     Weight --      Height --      Head  Circumference --      Peak Flow --      Pain Score 12/13/22 1755 8     Pain Loc --      Pain Education --      Exclude from Growth Chart --    No data found.  Updated Vital Signs BP 108/68 (BP Location: Right Arm)   Pulse 78   Temp 98.6 F (37 C) (Oral)   Resp 18   SpO2 97%   Visual Acuity Right Eye Distance:   Left Eye Distance:   Bilateral Distance:    Right Eye Near:   Left Eye Near:    Bilateral Near:     Physical Exam Vitals reviewed.  Constitutional:      General: He is not in acute distress.    Appearance: He is not ill-appearing, toxic-appearing or diaphoretic.  Musculoskeletal:     Comments: There is tenderness and swelling about 3 cm in diameter into the left lateral malleolus.  The lateral malleolus is not tender.  Skin:    Coloration: Skin is not pale.  Neurological:     General: No  focal deficit present.     Mental Status: He is alert and oriented to person, place, and time.  Psychiatric:        Behavior: Behavior normal.      UC Treatments / Results  Labs (all labs ordered are listed, but only abnormal results are displayed) Labs Reviewed - No data to display  EKG   Radiology DG Foot Complete Left  Result Date: 12/13/2022 CLINICAL DATA:  Left anterior and lateral foot pain, inversion injury EXAM: LEFT FOOT - COMPLETE 3+ VIEW COMPARISON:  None Available. FINDINGS: Frontal, oblique, and lateral views of the left foot are obtained. No fracture, subluxation, or dislocation. Joint spaces are well preserved. Soft tissues are unremarkable. IMPRESSION: 1. Unremarkable left foot. Electronically Signed   By: Sharlet Salina M.D.   On: 12/13/2022 18:48    Procedures Procedures (including critical care time)  Medications Ordered in UC Medications  ketorolac (TORADOL) 30 MG/ML injection 30 mg (has no administration in time range)    Initial Impression / Assessment and Plan / UC Course  I have reviewed the triage vital signs and the nursing  notes.  Pertinent labs & imaging results that were available during my care of the patient were reviewed by me and considered in my medical decision making (see chart for details).     X-rays are negative for fractures.  Toradol injection and Toradol tablets are provided for pain relief.  Ace wrap is supplied. Final Clinical Impressions(s) / UC Diagnoses   Final diagnoses:  Left foot pain     Discharge Instructions      There were no broken bones on your x-ray.  You have been given a shot of Toradol 30 mg today.  Ketorolac 10 mg tablets--take 1 tablet every 6 hours as needed for pain.  This is the same medicine that is in the shot we just gave you  Ice and elevate your foot when you can.       ED Prescriptions     Medication Sig Dispense Auth. Provider   ketorolac (TORADOL) 10 MG tablet Take 1 tablet (10 mg total) by mouth every 6 (six) hours as needed (pain). 20 tablet Amando Ishikawa, Janace Aris, MD      PDMP not reviewed this encounter.   Zenia Resides, MD 12/13/22 [phone]

## 2022-12-13 NOTE — ED Notes (Signed)
Reviewed work note 

## 2023-12-31 ENCOUNTER — Emergency Department (HOSPITAL_COMMUNITY)
Admission: EM | Admit: 2023-12-31 | Discharge: 2024-01-01 | Disposition: A | Attending: Emergency Medicine | Admitting: Emergency Medicine

## 2023-12-31 DIAGNOSIS — Z23 Encounter for immunization: Secondary | ICD-10-CM | POA: Insufficient documentation

## 2023-12-31 DIAGNOSIS — S01312A Laceration without foreign body of left ear, initial encounter: Secondary | ICD-10-CM | POA: Insufficient documentation
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
## 2024-01-01 ENCOUNTER — Encounter (HOSPITAL_COMMUNITY): Payer: Self-pay | Admitting: *Deleted

## 2024-01-01 ENCOUNTER — Other Ambulatory Visit: Payer: Self-pay

## 2024-01-01 MED ORDER — TETANUS-DIPHTH-ACELL PERTUSSIS 5-2-15.5 LF-MCG/0.5 IM SUSP
0.5000 mL | Freq: Once | INTRAMUSCULAR | Status: AC
  Administered 2024-01-01: 0.5 mL via INTRAMUSCULAR
  Filled 2024-01-01: qty 0.5

## 2024-01-01 NOTE — Discharge Instructions (Addendum)
 Avoid soaking your wound in stagnant or dirty water such as while taking a bath. You can shower normally. Keep the area clean with mild soap and warm water. Do not apply peroxide or alcohol to your wound as this can break down newly forming skin and prolong wound healing. If you keep the area bandaged, change the dressing/bandage at least once per day. Take tylenol  or ibuprofen  for pain. Have wound rechecked by primary care doctor in approximately 1 week.
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
## 2024-01-24 ENCOUNTER — Encounter: Payer: Self-pay | Admitting: Emergency Medicine

## 2024-01-24 ENCOUNTER — Ambulatory Visit
Admission: EM | Admit: 2024-01-24 | Discharge: 2024-01-24 | Disposition: A | Discharge status: Home / Self Care | Attending: Family Medicine | Admitting: Family Medicine

## 2024-01-24 DIAGNOSIS — J111 Influenza due to unidentified influenza virus with other respiratory manifestations: Secondary | ICD-10-CM

## 2024-01-24 MED ORDER — OSELTAMIVIR PHOSPHATE 75 MG PO CAPS: 75.0000 mg | ORAL_CAPSULE | Freq: Two times a day (BID) | ORAL | 0 refills | Status: AC

## 2024-01-24 MED ORDER — IBUPROFEN 600 MG PO TABS: 600.0000 mg | ORAL_TABLET | Freq: Three times a day (TID) | ORAL | 0 refills | Status: AC | PRN

## 2024-01-24 MED ORDER — ONDANSETRON 4 MG PO TBDP: 4.0000 mg | ORAL_TABLET | Freq: Three times a day (TID) | ORAL | 0 refills | Status: AC | PRN

## 2024-01-24 MED ORDER — BENZONATATE 100 MG PO CAPS: 100.0000 mg | ORAL_CAPSULE | Freq: Three times a day (TID) | ORAL | 0 refills | Status: AC | PRN

## 2024-01-24 NOTE — ED Triage Notes (Addendum)
 Pt reports productive cough, nasal congestion, fevers, body aches, and fatigue x3 days. Unsure of max temp. No sick contacts. Theraflu, nyquil, and ibuprofen  with mild relief. Pt reports positive flu test at home today - 3am.

## 2024-01-24 NOTE — Discharge Instructions (Addendum)
 Take oseltamivir  75 mg--1 capsule 2 times daily for 5 days  Ondansetron  dissolved in the mouth every 8 hours as needed for nausea or vomiting. Clear liquids(water, gatorade/pedialyte, ginger ale/sprite, chicken broth/soup) and bland things(crackers/toast, rice, potato, bananas) to eat. Avoid acidic foods like lemon/lime/orange/tomato, and avoid greasy/spicy foods.  Take ibuprofen  600 mg--1 tab every 8 hours as needed for pain.  Take benzonatate  100 mg, 1 tab every 8 hours as needed for cough.

## 2024-01-24 NOTE — ED Provider Notes (Signed)
 " [facility]    CSN: [id number] Arrival date & time: 01/24/24  1436      History   Chief Complaint Chief Complaint  Patient presents with   Cough   Fever   Nasal Congestion   Fatigue    HPI Arthur Vaughn is a 33 y.o. male.    Cough Associated symptoms: fever   Fever Associated symptoms: cough    Here for cough and fever and myalgia and chills and nasal congestion.  He has had decreased appetite probably some nausea, though no vomiting or diarrhea.  He did a home test today and has flu.  Test is negative for COVID  NKDA  History reviewed. No pertinent past medical history.  There are no active problems to display for this patient.   History reviewed. No pertinent surgical history.     Home Medications    Prior to Admission medications  Medication Sig Start Date End Date Taking? Authorizing Provider  benzonatate  (TESSALON ) 100 MG capsule Take 1 capsule (100 mg total) by mouth 3 (three) times daily as needed for cough. 01/24/24  Yes Vonna Sharlet POUR, MD  ibuprofen  (ADVIL ) 600 MG tablet Take 1 tablet (600 mg total) by mouth every 8 (eight) hours as needed (pain). 01/24/24  Yes Vonna Sharlet POUR, MD  ondansetron  (ZOFRAN -ODT) 4 MG disintegrating tablet Take 1 tablet (4 mg total) by mouth every 8 (eight) hours as needed for nausea or vomiting. 01/24/24  Yes Vonna Sharlet POUR, MD  oseltamivir  (TAMIFLU ) 75 MG capsule Take 1 capsule (75 mg total) by mouth every 12 (twelve) hours. 01/24/24  Yes Vonna Sharlet POUR, MD    Family History History reviewed. No pertinent family history.  Social History Social History[1]   Allergies   Patient has no known allergies.   Review of Systems Review of Systems  Constitutional:  Positive for fever.  Respiratory:  Positive for cough.      Physical Exam Triage Vital Signs ED Triage Vitals  Encounter Vitals Group     BP 01/24/24 1531 99/75     Girls Systolic BP Percentile --      Girls Diastolic BP  Percentile --      Boys Systolic BP Percentile --      Boys Diastolic BP Percentile --      Pulse Rate 01/24/24 1531 73     Resp 01/24/24 1531 18     Temp 01/24/24 1531 98.3 F (36.8 C)     Temp Source 01/24/24 1531 Oral     SpO2 01/24/24 1531 98 %     Weight --      Height --      Head Circumference --      Peak Flow --      Pain Score 01/24/24 1529 7     Pain Loc --      Pain Education --      Exclude from Growth Chart --    No data found.  Updated Vital Signs BP 99/75 (BP Location: Left Arm)   Pulse 73   Temp 98.3 F (36.8 C) (Oral)   Resp 18   SpO2 98%   Visual Acuity Right Eye Distance:   Left Eye Distance:   Bilateral Distance:    Right Eye Near:   Left Eye Near:    Bilateral Near:     Physical Exam Vitals reviewed.  Constitutional:      General: He is not in acute distress.    Appearance: He is not ill-appearing,  toxic-appearing or diaphoretic.  HENT:     Nose: Congestion present.     Mouth/Throat:     Mouth: Mucous membranes are moist.     Comments: There is erythema of the posterior oropharynx Eyes:     Extraocular Movements: Extraocular movements intact.     Conjunctiva/sclera: Conjunctivae normal.     Pupils: Pupils are equal, round, and reactive to light.  Cardiovascular:     Rate and Rhythm: Normal rate and regular rhythm.     Heart sounds: No murmur heard. Pulmonary:     Effort: Pulmonary effort is normal. No respiratory distress.     Breath sounds: Normal breath sounds. No stridor. No wheezing, rhonchi or rales.  Musculoskeletal:     Cervical back: Neck supple.  Lymphadenopathy:     Cervical: No cervical adenopathy.  Skin:    Capillary Refill: Capillary refill takes less than 2 seconds.     Coloration: Skin is not jaundiced or pale.  Neurological:     General: No focal deficit present.     Mental Status: He is alert and oriented to person, place, and time.  Psychiatric:        Behavior: Behavior normal.      UC Treatments /  Results  Labs (all labs ordered are listed, but only abnormal results are displayed) Labs Reviewed - No data to display  EKG   Radiology No results found.  Procedures Procedures (including critical care time)  Medications Ordered in UC Medications - No data to display  Initial Impression / Assessment and Plan / UC Course  I have reviewed the triage vital signs and the nursing notes.  Pertinent labs & imaging results that were available during my care of the patient were reviewed by me and considered in my medical decision making (see chart for details).     Tamiflu  was sent in for his influenza.  Tessalon  Perles and ibuprofen  are sent in for his symptoms. Final Clinical Impressions(s) / UC Diagnoses   Final diagnoses:  Influenza     Discharge Instructions      Take oseltamivir  75 mg--1 capsule 2 times daily for 5 days  Ondansetron  dissolved in the mouth every 8 hours as needed for nausea or vomiting. Clear liquids(water, gatorade/pedialyte, ginger ale/sprite, chicken broth/soup) and bland things(crackers/toast, rice, potato, bananas) to eat. Avoid acidic foods like lemon/lime/orange/tomato, and avoid greasy/spicy foods.  Take ibuprofen  600 mg--1 tab every 8 hours as needed for pain.  Take benzonatate  100 mg, 1 tab every 8 hours as needed for cough.      ED Prescriptions     Medication Sig Dispense Auth. Provider   oseltamivir  (TAMIFLU ) 75 MG capsule Take 1 capsule (75 mg total) by mouth every 12 (twelve) hours. 10 capsule Vonna Sharlet POUR, MD   benzonatate  (TESSALON ) 100 MG capsule Take 1 capsule (100 mg total) by mouth 3 (three) times daily as needed for cough. 21 capsule Vonna Sharlet POUR, MD   ibuprofen  (ADVIL ) 600 MG tablet Take 1 tablet (600 mg total) by mouth every 8 (eight) hours as needed (pain). 15 tablet Jaythan Hinely, Sharlet POUR, MD   ondansetron  (ZOFRAN -ODT) 4 MG disintegrating tablet Take 1 tablet (4 mg total) by mouth every 8 (eight) hours as needed for  nausea or vomiting. 10 tablet Vonna Markcus Lazenby K, MD      PDMP not reviewed this encounter.    [1]  Social History Tobacco Use   Smoking status: Some Days    Types: Cigarettes   Smokeless tobacco: Never  Vaping Use   Vaping status: Former  Substance Use Topics   Alcohol use: Yes    Comment: occass   Drug use: Not Currently    Types: Marijuana     Vonna Sharlet POUR, MD 01/24/24 1620  "
